# Patient Record
Sex: Female | Born: 1955
Health system: Southern US, Community
[De-identification: ages and names within clinical notes are randomized; demographics above are authoritative.]

## PROBLEM LIST (undated history)

## (undated) HISTORY — PX: TONSILECTOMY, ADENOIDECTOMY, BILATERAL MYRINGOTOMY AND TUBES: SHX2538

---

## 1997-06-10 ENCOUNTER — Ambulatory Visit (HOSPITAL_COMMUNITY): Admission: RE | Admit: 1997-06-10 | Discharge: 1997-06-10 | Payer: Self-pay | Admitting: Obstetrics and Gynecology

## 1997-06-15 ENCOUNTER — Ambulatory Visit (HOSPITAL_COMMUNITY): Admission: RE | Admit: 1997-06-15 | Discharge: 1997-06-15 | Payer: Self-pay | Admitting: Obstetrics and Gynecology

## 1998-01-10 ENCOUNTER — Ambulatory Visit (HOSPITAL_COMMUNITY): Admission: RE | Admit: 1998-01-10 | Discharge: 1998-01-10 | Payer: Self-pay | Admitting: Obstetrics and Gynecology

## 2003-06-14 ENCOUNTER — Ambulatory Visit (HOSPITAL_BASED_OUTPATIENT_CLINIC_OR_DEPARTMENT_OTHER): Admission: RE | Admit: 2003-06-14 | Discharge: 2003-06-14 | Payer: Self-pay | Admitting: Otolaryngology

## 2004-08-06 ENCOUNTER — Other Ambulatory Visit: Admission: RE | Admit: 2004-08-06 | Discharge: 2004-08-06 | Payer: Self-pay | Admitting: Family Medicine

## 2005-02-07 ENCOUNTER — Ambulatory Visit (HOSPITAL_COMMUNITY): Admission: RE | Admit: 2005-02-07 | Discharge: 2005-02-07 | Payer: Self-pay | Admitting: Family Medicine

## 2005-09-23 ENCOUNTER — Other Ambulatory Visit: Admission: RE | Admit: 2005-09-23 | Discharge: 2005-09-23 | Payer: Self-pay | Admitting: Family Medicine

## 2007-01-01 ENCOUNTER — Other Ambulatory Visit: Admission: RE | Admit: 2007-01-01 | Discharge: 2007-01-01 | Payer: Self-pay | Admitting: Family Medicine

## 2007-02-18 ENCOUNTER — Ambulatory Visit (HOSPITAL_COMMUNITY): Admission: RE | Admit: 2007-02-18 | Discharge: 2007-02-18 | Payer: Self-pay | Admitting: Family Medicine

## 2008-03-23 ENCOUNTER — Other Ambulatory Visit: Admission: RE | Admit: 2008-03-23 | Discharge: 2008-03-23 | Payer: Self-pay | Admitting: Family Medicine

## 2008-11-09 ENCOUNTER — Ambulatory Visit (HOSPITAL_COMMUNITY): Admission: RE | Admit: 2008-11-09 | Discharge: 2008-11-09 | Payer: Self-pay | Admitting: Family Medicine

## 2008-12-23 ENCOUNTER — Encounter: Admission: RE | Admit: 2008-12-23 | Discharge: 2008-12-23 | Payer: Self-pay | Admitting: Family Medicine

## 2009-07-07 ENCOUNTER — Other Ambulatory Visit: Admission: RE | Admit: 2009-07-07 | Discharge: 2009-07-07 | Payer: Self-pay | Admitting: Family Medicine

## 2009-12-13 IMAGING — US US BREAST L
1 series · 5 of 5 positions shown · non-contrast
Comparison: Prior mammograms dated 02/18/2007 and 11/09/2008

CLINICAL DATA: Palpable left breast mass

DIGITAL DIAGNOSTIC  LEFT  MAMMOGRAM  WITH CAD AND LEFT BREAST
ULTRASOUND:

[Series 1: us breast left · 5 of 5 slices shown]
[im 1/5]
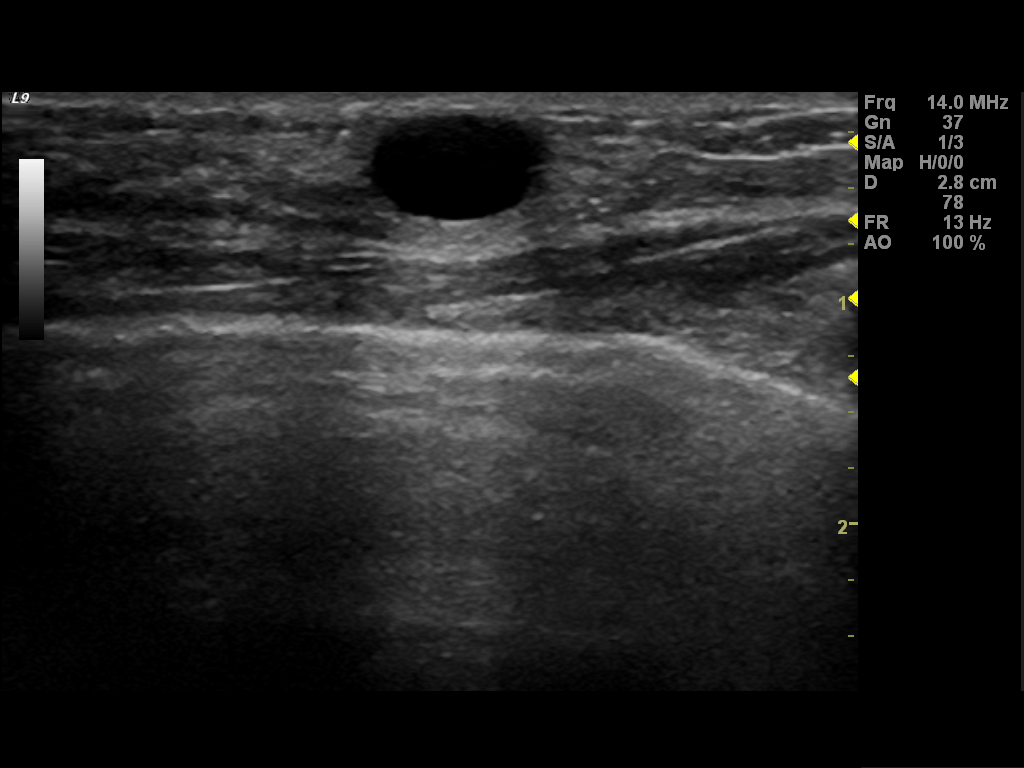
[im 2/5]
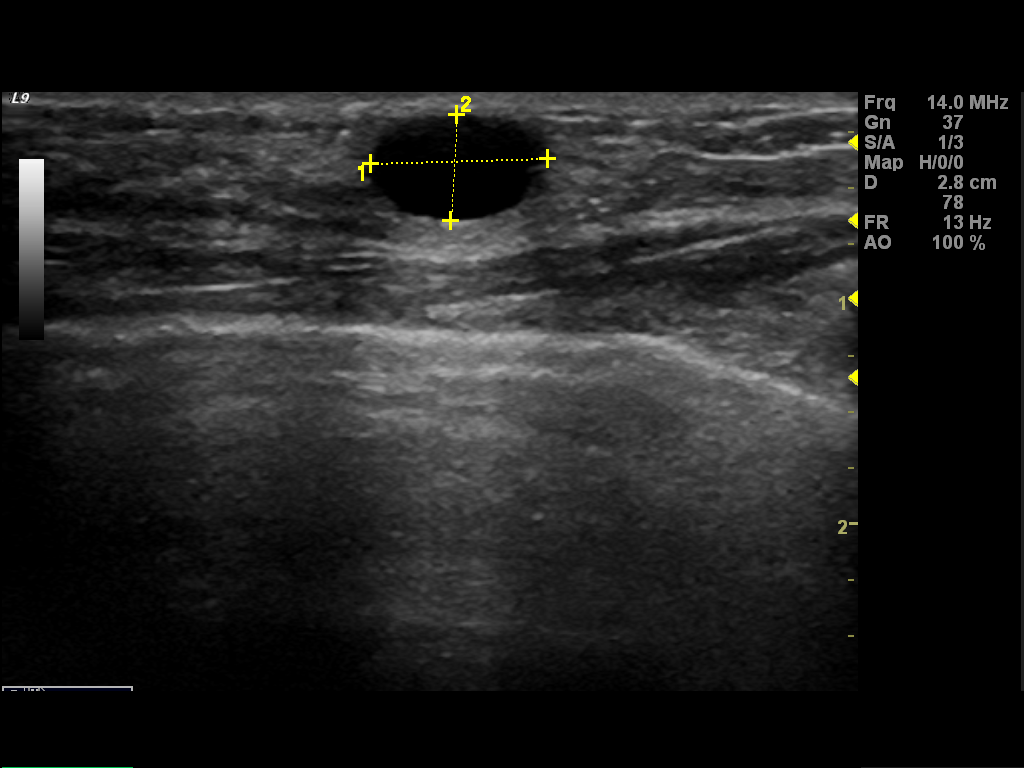
[im 3/5]
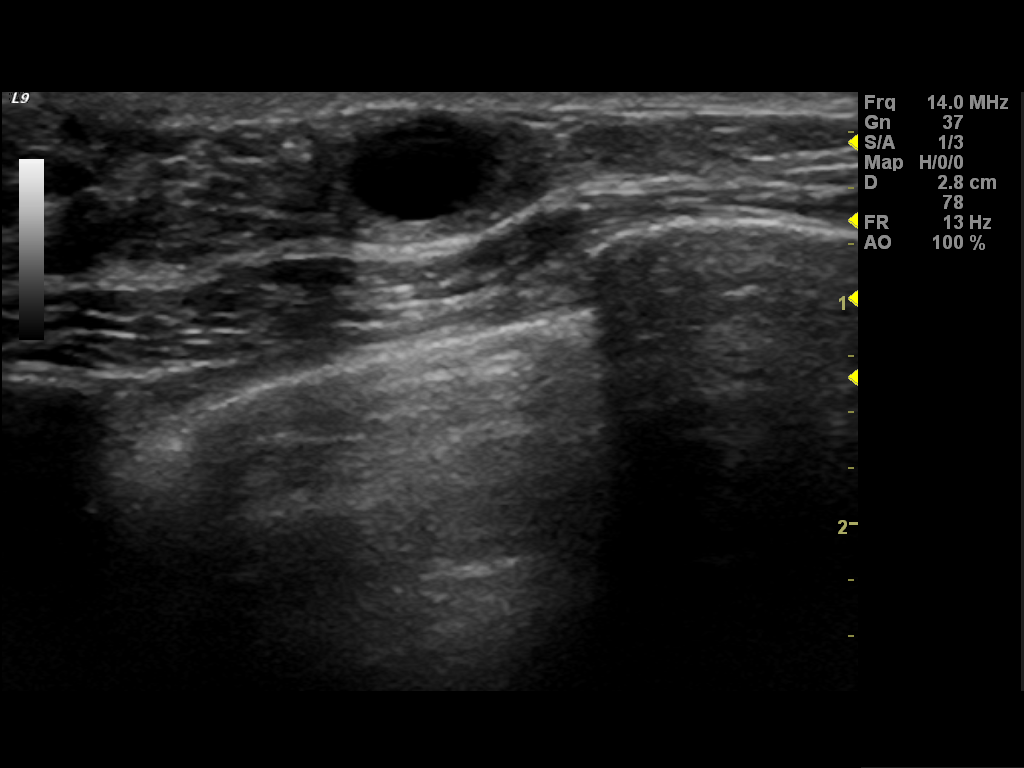
[im 4/5]
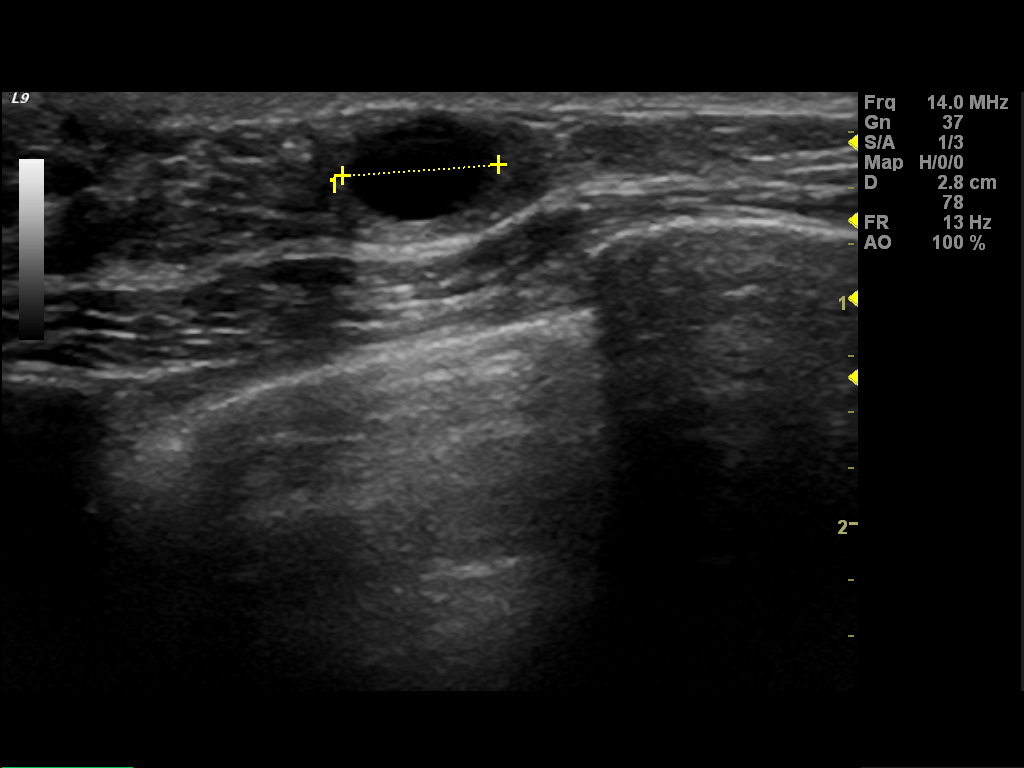
[im 5/5]
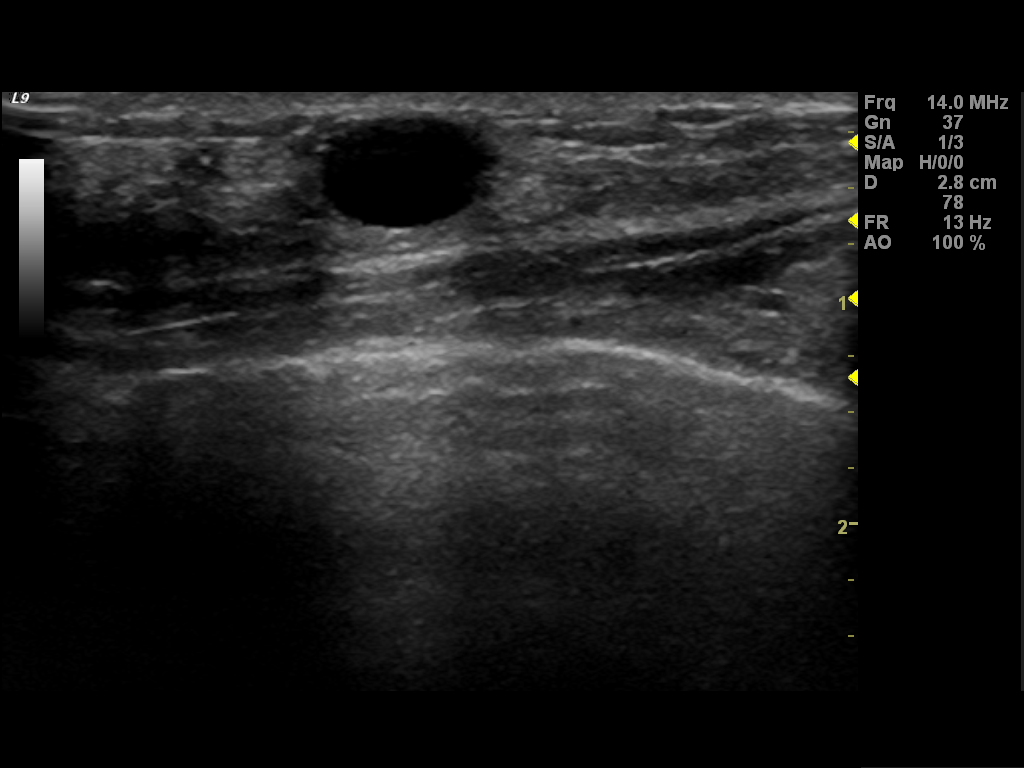

[5 of 5 positions shown; findings below may reference images not displayed]

FINDINGS: There is a dense fibroglandular pattern.  On the spot
tangential view an obscured mass is seen in the lower outer
quadrant of the breast.  There is no malignant-type
microcalcifications.
Mammographic images were processed with CAD.

On physical exam, , I palpate a discrete mass in the left breast at
5 o'clock 2 cm from the nipple.  It is freely mobile.

Ultrasound is performed, showing there is a simple cyst in the left
breast at 5 o'clock 2 cm from the nipple measuring 8 x 5 x 7 mm.
IMPRESSION: Left breast cysts.  No evidence of malignancy.  Bilateral screening
mammogram in November 2009 is recommended.

BI-RADS CATEGORY 2:  Benign finding(s).

## 2010-01-16 ENCOUNTER — Ambulatory Visit (HOSPITAL_COMMUNITY): Admission: RE | Admit: 2010-01-16 | Discharge: 2010-01-16 | Payer: Self-pay | Admitting: Family Medicine

## 2010-07-11 ENCOUNTER — Other Ambulatory Visit (HOSPITAL_COMMUNITY)
Admission: RE | Admit: 2010-07-11 | Discharge: 2010-07-11 | Disposition: A | Discharge status: Home / Self Care | Payer: 59 | Source: Ambulatory Visit | Attending: Family Medicine | Admitting: Family Medicine

## 2010-07-11 ENCOUNTER — Other Ambulatory Visit: Payer: Self-pay | Admitting: Family Medicine

## 2010-07-11 DIAGNOSIS — Z124 Encounter for screening for malignant neoplasm of cervix: Secondary | ICD-10-CM | POA: Insufficient documentation

## 2010-08-24 NOTE — Op Note (Signed)
NAME:  JHADE, BERKO                        ACCOUNT NO.:  [id number]   MEDICAL RECORD NO.:  [id number]                   PATIENT TYPE:  AMB   LOCATION:  [facility]                                  FACILITY:  [facility]   PHYSICIAN:  Christopher E. Ezzard Standing, M.D.         DATE OF BIRTH:  [birth year]   DATE OF PROCEDURE:  06/14/2003  DATE OF DISCHARGE:                                 OPERATIVE REPORT   PREOPERATIVE DIAGNOSIS:  Left tonsil cyst with inflammation.   POSTOPERATIVE DIAGNOSIS:  Left tonsil cyst with inflammation.   OPERATION:  Excision of left tonsil tissue.   SURGEON:  Kristine Garbe. Ezzard Standing, M.D.   ANESTHESIA:  Local 1% Xylocaine with 1:100,000 epinephrine.   COMPLICATIONS:  None.   BRIEF CLINICAL NOTE:  Annette Cannon is a 55 year old female who has had  previous tonsillectomy.  More recently, she has had problems with some  residual tonsillar tissue in the left tonsillar fossa.  She had recurrent  white debris collect within a tonsil cyst within the tonsil tissue.  The  residual area of tonsillar tissue measures approximately 1 by 2 cm size and  has a small cyst within the tonsillar tissue with white debris within the  tonsil cyst.  She is taken to the operating room at this time for excision  of residual tonsil tissue.   DESCRIPTION OF PROCEDURE:  The left tonsil area was injected with 2 mL of  Xylocaine with epinephrine for local anesthesia.  The residual tonsillar  tissue was then excised with a 15 blade scalpel and curved scissors.  After  excising the tonsillar tissue, the area was cauterized with silver nitrate.  The patient did not have any substantial bleeding.  She tolerated this well.   DISPOSITION:  Zaria is discharged home later this morning on Amoxicillin  500 mg t.i.d. for five days, Tylenol and Vicodin p.r.n. pain.  She is to  notify the office if she has any problems.                                               Kristine Garbe. Ezzard Standing, M.D.    CEN/MEDQ   D:  06/14/2003  T:  06/14/2003  Job:  [id number]

## 2011-01-29 ENCOUNTER — Other Ambulatory Visit (HOSPITAL_COMMUNITY): Payer: Self-pay | Admitting: Family Medicine

## 2011-01-29 DIAGNOSIS — Z1231 Encounter for screening mammogram for malignant neoplasm of breast: Secondary | ICD-10-CM

## 2011-02-18 ENCOUNTER — Ambulatory Visit (HOSPITAL_COMMUNITY)
Admission: RE | Admit: 2011-02-18 | Discharge: 2011-02-18 | Disposition: A | Discharge status: Home / Self Care | Payer: 59 | Source: Ambulatory Visit | Attending: Family Medicine | Admitting: Family Medicine

## 2011-02-18 DIAGNOSIS — Z1231 Encounter for screening mammogram for malignant neoplasm of breast: Secondary | ICD-10-CM

## 2012-09-23 ENCOUNTER — Other Ambulatory Visit (HOSPITAL_COMMUNITY): Payer: Self-pay | Admitting: Family Medicine

## 2012-09-23 DIAGNOSIS — Z1231 Encounter for screening mammogram for malignant neoplasm of breast: Secondary | ICD-10-CM

## 2012-10-13 ENCOUNTER — Ambulatory Visit (HOSPITAL_COMMUNITY)
Admission: RE | Admit: 2012-10-13 | Discharge: 2012-10-13 | Disposition: A | Discharge status: Home / Self Care | Payer: 59 | Source: Ambulatory Visit | Attending: Family Medicine | Admitting: Family Medicine

## 2012-10-13 DIAGNOSIS — Z1231 Encounter for screening mammogram for malignant neoplasm of breast: Secondary | ICD-10-CM

## 2012-11-26 ENCOUNTER — Other Ambulatory Visit: Payer: Self-pay | Admitting: Obstetrics and Gynecology

## 2013-07-22 ENCOUNTER — Other Ambulatory Visit (HOSPITAL_COMMUNITY)
Admission: RE | Admit: 2013-07-22 | Discharge: 2013-07-22 | Disposition: A | Discharge status: Home / Self Care | Payer: 59 | Source: Ambulatory Visit | Attending: Family Medicine | Admitting: Family Medicine

## 2013-07-22 ENCOUNTER — Other Ambulatory Visit: Payer: Self-pay | Admitting: Family Medicine

## 2013-07-22 DIAGNOSIS — Z124 Encounter for screening for malignant neoplasm of cervix: Secondary | ICD-10-CM | POA: Insufficient documentation

## 2016-04-26 DIAGNOSIS — S99929A Unspecified injury of unspecified foot, initial encounter: Secondary | ICD-10-CM | POA: Diagnosis not present

## 2016-06-18 DIAGNOSIS — N63 Unspecified lump in unspecified breast: Secondary | ICD-10-CM | POA: Diagnosis not present

## 2016-06-21 ENCOUNTER — Other Ambulatory Visit: Payer: Self-pay | Admitting: Gynecology

## 2016-06-21 DIAGNOSIS — N63 Unspecified lump in unspecified breast: Secondary | ICD-10-CM

## 2016-07-02 ENCOUNTER — Ambulatory Visit
Admission: RE | Admit: 2016-07-02 | Discharge: 2016-07-02 | Disposition: A | Discharge status: Home / Self Care | Payer: 59 | Source: Ambulatory Visit | Attending: Gynecology | Admitting: Gynecology

## 2016-07-02 DIAGNOSIS — N6321 Unspecified lump in the left breast, upper outer quadrant: Secondary | ICD-10-CM | POA: Diagnosis not present

## 2016-07-02 DIAGNOSIS — N63 Unspecified lump in unspecified breast: Secondary | ICD-10-CM

## 2016-07-02 DIAGNOSIS — N6012 Diffuse cystic mastopathy of left breast: Secondary | ICD-10-CM | POA: Diagnosis not present

## 2016-07-02 DIAGNOSIS — N6313 Unspecified lump in the right breast, lower outer quadrant: Secondary | ICD-10-CM | POA: Diagnosis not present

## 2016-08-06 DIAGNOSIS — Z01419 Encounter for gynecological examination (general) (routine) without abnormal findings: Secondary | ICD-10-CM | POA: Diagnosis not present

## 2016-08-06 DIAGNOSIS — Z780 Asymptomatic menopausal state: Secondary | ICD-10-CM | POA: Diagnosis not present

## 2016-08-08 ENCOUNTER — Other Ambulatory Visit: Payer: Self-pay | Admitting: Family Medicine

## 2016-08-08 ENCOUNTER — Other Ambulatory Visit (HOSPITAL_COMMUNITY)
Admission: RE | Admit: 2016-08-08 | Discharge: 2016-08-08 | Disposition: A | Discharge status: Home / Self Care | Payer: 59 | Source: Ambulatory Visit | Attending: Family Medicine | Admitting: Family Medicine

## 2016-08-08 DIAGNOSIS — Z124 Encounter for screening for malignant neoplasm of cervix: Secondary | ICD-10-CM | POA: Diagnosis not present

## 2016-08-08 DIAGNOSIS — N951 Menopausal and female climacteric states: Secondary | ICD-10-CM | POA: Diagnosis not present

## 2016-08-08 DIAGNOSIS — Z0000 Encounter for general adult medical examination without abnormal findings: Secondary | ICD-10-CM | POA: Diagnosis not present

## 2016-08-08 DIAGNOSIS — M859 Disorder of bone density and structure, unspecified: Secondary | ICD-10-CM | POA: Diagnosis not present

## 2016-08-12 LAB — CYTOLOGY - PAP: Diagnosis: NEGATIVE

## 2016-10-04 DIAGNOSIS — L578 Other skin changes due to chronic exposure to nonionizing radiation: Secondary | ICD-10-CM | POA: Diagnosis not present

## 2016-10-04 DIAGNOSIS — D225 Melanocytic nevi of trunk: Secondary | ICD-10-CM | POA: Diagnosis not present

## 2016-10-04 DIAGNOSIS — L237 Allergic contact dermatitis due to plants, except food: Secondary | ICD-10-CM | POA: Diagnosis not present

## 2016-10-16 DIAGNOSIS — M25511 Pain in right shoulder: Secondary | ICD-10-CM | POA: Diagnosis not present

## 2016-10-16 DIAGNOSIS — M19011 Primary osteoarthritis, right shoulder: Secondary | ICD-10-CM | POA: Diagnosis not present

## 2016-11-05 DIAGNOSIS — M25511 Pain in right shoulder: Secondary | ICD-10-CM | POA: Diagnosis not present

## 2016-11-13 DIAGNOSIS — M19011 Primary osteoarthritis, right shoulder: Secondary | ICD-10-CM | POA: Diagnosis not present

## 2017-01-17 ENCOUNTER — Other Ambulatory Visit: Payer: Self-pay | Admitting: Gynecology

## 2017-01-17 DIAGNOSIS — N6001 Solitary cyst of right breast: Secondary | ICD-10-CM

## 2017-02-18 ENCOUNTER — Ambulatory Visit
Admission: RE | Admit: 2017-02-18 | Discharge: 2017-02-18 | Disposition: A | Discharge status: Home / Self Care | Payer: 59 | Source: Ambulatory Visit | Attending: Gynecology | Admitting: Gynecology

## 2017-02-18 ENCOUNTER — Other Ambulatory Visit: Payer: Self-pay | Admitting: Gynecology

## 2017-02-18 DIAGNOSIS — N6313 Unspecified lump in the right breast, lower outer quadrant: Secondary | ICD-10-CM | POA: Diagnosis not present

## 2017-02-18 DIAGNOSIS — N6001 Solitary cyst of right breast: Secondary | ICD-10-CM

## 2017-02-18 DIAGNOSIS — N6311 Unspecified lump in the right breast, upper outer quadrant: Secondary | ICD-10-CM | POA: Diagnosis not present

## 2017-03-10 DIAGNOSIS — Z23 Encounter for immunization: Secondary | ICD-10-CM | POA: Diagnosis not present

## 2017-06-19 ENCOUNTER — Other Ambulatory Visit: Payer: 59

## 2017-06-23 ENCOUNTER — Encounter: Payer: Self-pay | Admitting: Podiatry

## 2017-06-23 ENCOUNTER — Other Ambulatory Visit: Payer: Self-pay

## 2017-06-23 ENCOUNTER — Telehealth: Payer: Self-pay | Admitting: Podiatry

## 2017-06-23 ENCOUNTER — Ambulatory Visit: Payer: 59 | Admitting: Podiatry

## 2017-06-23 DIAGNOSIS — Z780 Asymptomatic menopausal state: Secondary | ICD-10-CM | POA: Insufficient documentation

## 2017-06-23 DIAGNOSIS — L03031 Cellulitis of right toe: Secondary | ICD-10-CM | POA: Diagnosis not present

## 2017-06-23 NOTE — Telephone Encounter (Signed)
I just got home from having a procedure done by Dr. Paulla Dolly and I just have a few questions. My long term care instructions show that you have had your ingrown toenail and root treated with a chemical. The doctor said that he did not do the treatment with a chemical so I would like that removed from my chart as I understand that was not done, he didn't see the need to do that. Then my other question is it okay to take Aleve for pain or should it be another product? Is there a reason you would prefer not to have Aleve. I would appreciate a call back so I can take some pain medication before too much longer and take the right thing. You can reach me at [phone]. Thank you so much.

## 2017-06-23 NOTE — Telephone Encounter (Signed)
I informed pt that in her clinicals her diagnosis was paronychia, which was a removal without the application of the chemical, pt states she was concerned that the statement "you have had a chemical applied" would cause a problem with insurance coverage. I told pt I would see if that could be removed from her chart, and I would make a note in the records of this call. Pt asked if it was okay to take Aleve and I told her if it would help with her discomfort that would be fine.

## 2017-06-23 NOTE — Progress Notes (Signed)
Subjective:   Patient ID: Annette Cannon, female   DOB: 62 y.o.   MRN: [id number]   HPI Patient presents with an incurvated ingrown toenail right hallux medial border mostly in the proximal portion of the nail bed with redness and discomfort for about 5 weeks.  Patient does not smoke and likes to be active   Review of Systems  All other systems reviewed and are negative.       Objective:  Physical Exam  Constitutional: She appears well-developed and well-nourished.  Cardiovascular: Intact distal pulses.  Pulmonary/Chest: Effort normal.  Musculoskeletal: Normal range of motion.  Neurological: She is alert.  Skin: Skin is warm.  Nursing note and vitals reviewed.   Neurovascular status found to be intact muscle strength was adequate range of motion within normal limits with patient found to have slight redness on the proximal portion of the right hallux nail medial side localized in nature with no distal drainage noted.  Patient has good digital perfusion well oriented x3    Assessment:  Localized paronychia infection right hallux medial border with probable ingrown toenail component     Plan:  H&P conditions reviewed and I recommended removal of the nail border allowing drainage to occur and possible permanent procedure at one point future.  Today I infiltrated the right hallux 60 mg like Marcaine mixture and I removed the medial border under sterile technique after sterile prep of the toe flush the area applied sterile dressing instructed on soaks and bandage therapy and reappoint if symptoms were to persist

## 2017-07-03 ENCOUNTER — Ambulatory Visit
Admission: RE | Admit: 2017-07-03 | Discharge: 2017-07-03 | Disposition: A | Discharge status: Home / Self Care | Payer: 59 | Source: Ambulatory Visit | Attending: Gynecology | Admitting: Gynecology

## 2017-07-03 DIAGNOSIS — N6310 Unspecified lump in the right breast, unspecified quadrant: Secondary | ICD-10-CM | POA: Diagnosis not present

## 2017-07-03 DIAGNOSIS — N6001 Solitary cyst of right breast: Secondary | ICD-10-CM

## 2017-07-03 DIAGNOSIS — R922 Inconclusive mammogram: Secondary | ICD-10-CM | POA: Diagnosis not present

## 2017-07-08 ENCOUNTER — Telehealth: Payer: Self-pay | Admitting: Podiatry

## 2017-07-08 NOTE — Telephone Encounter (Signed)
Pt states the area seems to be healing well, but she would like to know if what she experienced at the time of the appt. Pt states she was not fully prepared for the procedure, in that she had other plans and did not know it would cause sucha disruption in her activities due to pain, and shoe wear. Pt states she was not numb for the procedure and when Dr. Paulla Dolly was informed he said well let's see and performed the procedure, although it was excruciating, then once it was complete said I am sorry and I will put a little more medicine in now, but if I had done it before it would have taken longer. Pt states she did not get our after visit instructions and was in exquisite pain all night and only got relief with the foot down. I apologized to the pt and said it was not the normal operation procedure and I would inform the office manager and we would use it as a learning experience for [place].

## 2017-07-08 NOTE — Telephone Encounter (Signed)
I had a paronychia procedure done two weeks ago yesterday I think. I have some questions and I wanted to check in with the nurse. I would appreciate a call back at [phone]. Thank you.

## 2017-08-28 DIAGNOSIS — Z0000 Encounter for general adult medical examination without abnormal findings: Secondary | ICD-10-CM | POA: Diagnosis not present

## 2017-09-30 DIAGNOSIS — H1013 Acute atopic conjunctivitis, bilateral: Secondary | ICD-10-CM | POA: Diagnosis not present

## 2017-10-02 DIAGNOSIS — Z780 Asymptomatic menopausal state: Secondary | ICD-10-CM | POA: Diagnosis not present

## 2017-10-02 DIAGNOSIS — Z01419 Encounter for gynecological examination (general) (routine) without abnormal findings: Secondary | ICD-10-CM | POA: Diagnosis not present

## 2017-10-03 DIAGNOSIS — D225 Melanocytic nevi of trunk: Secondary | ICD-10-CM | POA: Diagnosis not present

## 2017-10-03 DIAGNOSIS — L578 Other skin changes due to chronic exposure to nonionizing radiation: Secondary | ICD-10-CM | POA: Diagnosis not present

## 2019-01-20 ENCOUNTER — Other Ambulatory Visit: Payer: Self-pay | Admitting: Gynecology

## 2019-01-20 DIAGNOSIS — N6310 Unspecified lump in the right breast, unspecified quadrant: Secondary | ICD-10-CM

## 2019-02-26 ENCOUNTER — Ambulatory Visit
Admission: RE | Admit: 2019-02-26 | Discharge: 2019-02-26 | Disposition: A | Discharge status: Home / Self Care | Payer: Medicare Other | Source: Ambulatory Visit | Attending: Gynecology | Admitting: Gynecology

## 2019-02-26 ENCOUNTER — Other Ambulatory Visit: Payer: Self-pay

## 2019-02-26 ENCOUNTER — Other Ambulatory Visit: Payer: Self-pay | Admitting: Gynecology

## 2019-02-26 DIAGNOSIS — N6310 Unspecified lump in the right breast, unspecified quadrant: Secondary | ICD-10-CM

## 2019-05-04 ENCOUNTER — Ambulatory Visit: Payer: Medicare Other

## 2019-05-13 ENCOUNTER — Ambulatory Visit: Payer: Medicare Other | Attending: Internal Medicine

## 2019-05-13 DIAGNOSIS — Z23 Encounter for immunization: Secondary | ICD-10-CM | POA: Insufficient documentation

## 2019-05-13 NOTE — Progress Notes (Signed)
   Covid-19 Vaccination Clinic  Name:  Annette Cannon    MRN: [id number] DOB: 07-30-1955  05/13/2019  [name] was observed post Covid-19 immunization for 15 minutes without incidence. She was provided with Vaccine Information Sheet and instruction to access the V-Safe system.   [name] was instructed to call 911 with any severe reactions post vaccine: Marland Kitchen Difficulty breathing  . Swelling of your face and throat  . A fast heartbeat  . A bad rash all over your body  . Dizziness and weakness    Immunizations Administered    Name Date Dose VIS Date Route   Pfizer COVID-19 Vaccine 05/13/2019 10:02 AM 0.3 mL 03/19/2019 Intramuscular   Manufacturer: [place]   Lot: [id number]   [place]: [id number]

## 2019-05-25 ENCOUNTER — Ambulatory Visit: Payer: Medicare Other

## 2019-06-07 ENCOUNTER — Ambulatory Visit: Payer: Medicare Other | Attending: Internal Medicine

## 2019-06-07 DIAGNOSIS — Z23 Encounter for immunization: Secondary | ICD-10-CM | POA: Insufficient documentation

## 2019-06-07 NOTE — Progress Notes (Signed)
   Covid-19 Vaccination Clinic  Name:  AYVIANA CANTO    MRN: [id number] DOB: [birth year]  06/07/2019  Ms. Butler was observed post Covid-19 immunization for 15 minutes without incidence. She was provided with Vaccine Information Sheet and instruction to access the V-Safe system.   Ms. Kibler was instructed to call 911 with any severe reactions post vaccine: Marland Kitchen Difficulty breathing  . Swelling of your face and throat  . A fast heartbeat  . A bad rash all over your body  . Dizziness and weakness    Immunizations Administered    Name Date Dose VIS Date Route   Pfizer COVID-19 Vaccine 06/07/2019  2:10 PM 0.3 mL 03/19/2019 Intramuscular   Manufacturer: [place]   Lot: [id number]   [place]: [id number]

## 2019-08-27 ENCOUNTER — Ambulatory Visit
Admission: RE | Admit: 2019-08-27 | Discharge: 2019-08-27 | Disposition: A | Discharge status: Home / Self Care | Payer: Medicare Other | Source: Ambulatory Visit | Attending: Gynecology | Admitting: Gynecology

## 2019-08-27 ENCOUNTER — Other Ambulatory Visit: Payer: Self-pay

## 2019-08-27 DIAGNOSIS — N6310 Unspecified lump in the right breast, unspecified quadrant: Secondary | ICD-10-CM

## 2019-09-02 ENCOUNTER — Other Ambulatory Visit: Payer: Self-pay | Admitting: Family Medicine

## 2019-09-02 DIAGNOSIS — M859 Disorder of bone density and structure, unspecified: Secondary | ICD-10-CM

## 2019-12-16 ENCOUNTER — Other Ambulatory Visit: Payer: Self-pay | Admitting: Family Medicine

## 2019-12-16 DIAGNOSIS — M859 Disorder of bone density and structure, unspecified: Secondary | ICD-10-CM

## 2019-12-16 DIAGNOSIS — M8580 Other specified disorders of bone density and structure, unspecified site: Secondary | ICD-10-CM

## 2019-12-20 ENCOUNTER — Ambulatory Visit
Admission: RE | Admit: 2019-12-20 | Discharge: 2019-12-20 | Disposition: A | Discharge status: Home / Self Care | Payer: Medicare Other | Source: Ambulatory Visit | Attending: Family Medicine | Admitting: Family Medicine

## 2019-12-20 ENCOUNTER — Other Ambulatory Visit: Payer: Self-pay

## 2019-12-20 DIAGNOSIS — M8580 Other specified disorders of bone density and structure, unspecified site: Secondary | ICD-10-CM

## 2019-12-20 DIAGNOSIS — M859 Disorder of bone density and structure, unspecified: Secondary | ICD-10-CM

## 2019-12-31 ENCOUNTER — Other Ambulatory Visit: Payer: Self-pay | Admitting: Gynecology

## 2019-12-31 DIAGNOSIS — Z1231 Encounter for screening mammogram for malignant neoplasm of breast: Secondary | ICD-10-CM

## 2020-02-28 ENCOUNTER — Other Ambulatory Visit: Payer: Self-pay

## 2020-02-28 ENCOUNTER — Ambulatory Visit
Admission: RE | Admit: 2020-02-28 | Discharge: 2020-02-28 | Disposition: A | Discharge status: Home / Self Care | Payer: Medicare Other | Source: Ambulatory Visit | Attending: Gynecology | Admitting: Gynecology

## 2020-02-28 DIAGNOSIS — Z1231 Encounter for screening mammogram for malignant neoplasm of breast: Secondary | ICD-10-CM

## 2021-01-15 ENCOUNTER — Other Ambulatory Visit: Payer: Self-pay | Admitting: Gynecology

## 2021-01-15 DIAGNOSIS — Z1231 Encounter for screening mammogram for malignant neoplasm of breast: Secondary | ICD-10-CM

## 2021-02-17 IMAGING — MG DIGITAL SCREENING BILAT W/ TOMO W/ CAD
8 series · 9 of 24 positions shown · non-contrast
Comparison: Previous exam(s).

CLINICAL DATA: Screening.

EXAM:
DIGITAL SCREENING BILATERAL MAMMOGRAM WITH TOMO AND CAD

[L MLO synth-2D]
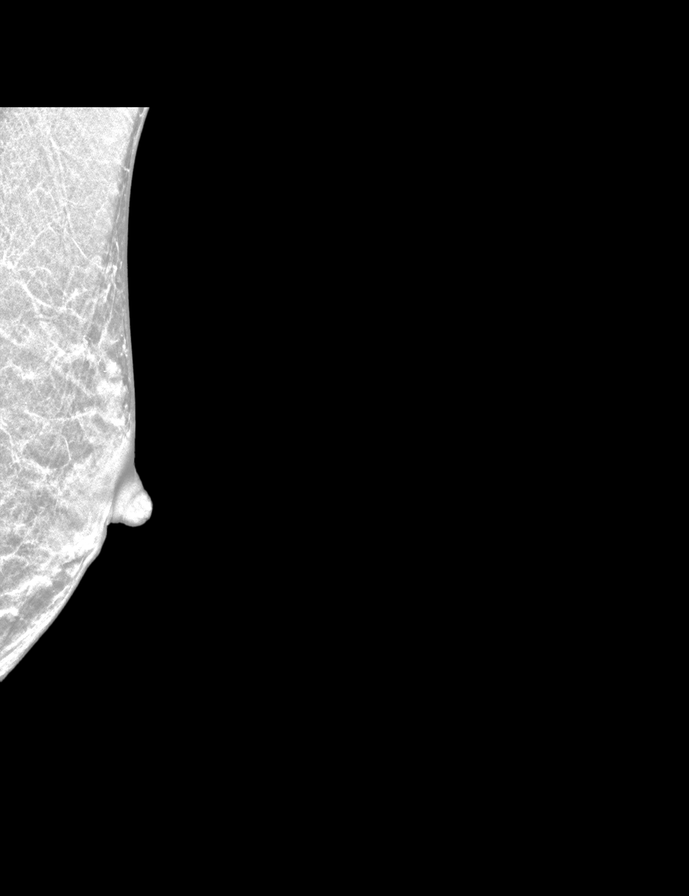

[R CC synth-2D]
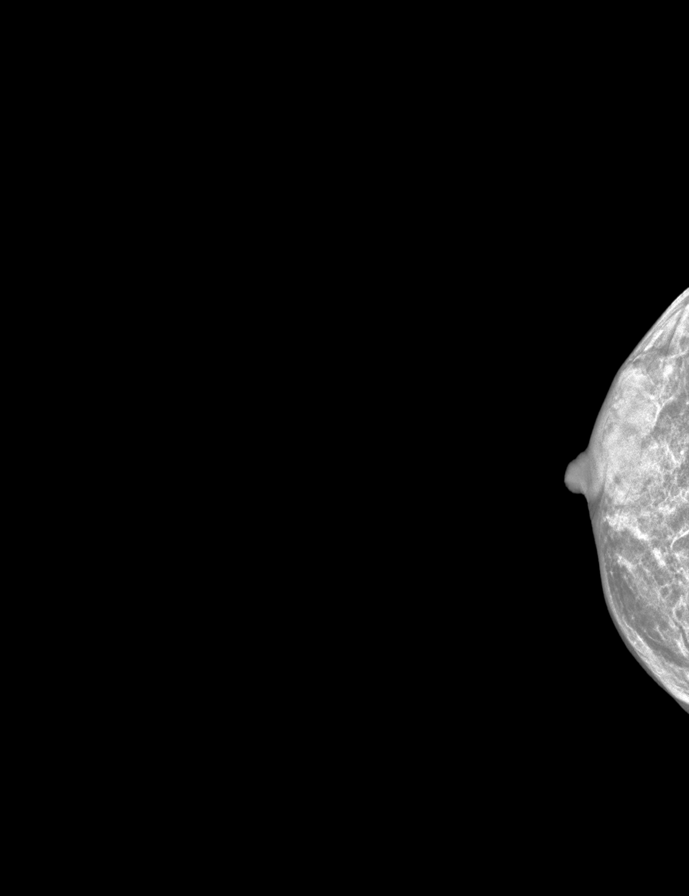

[L CC synth-2D]
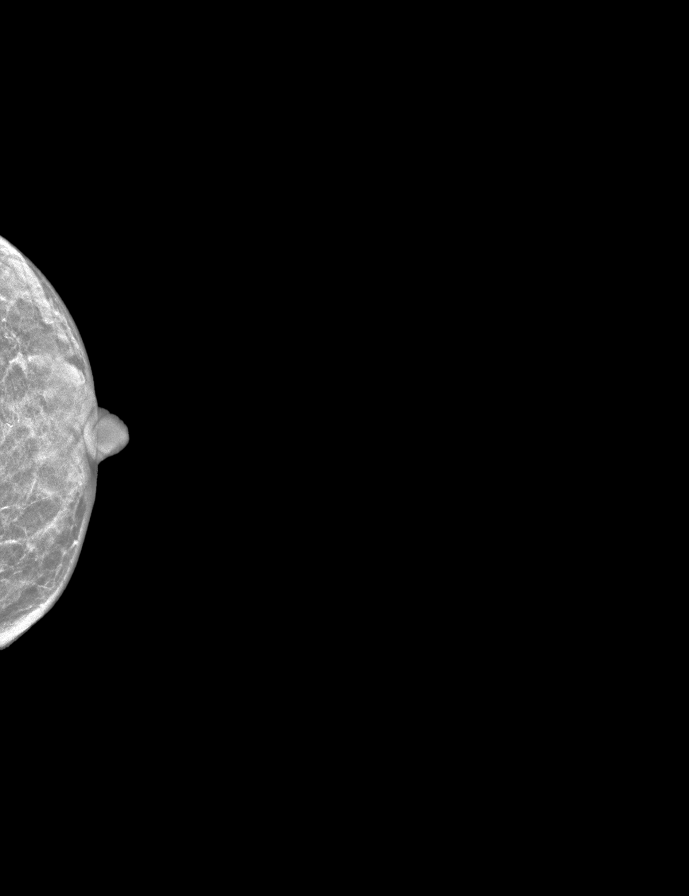

[R MLO synth-2D]
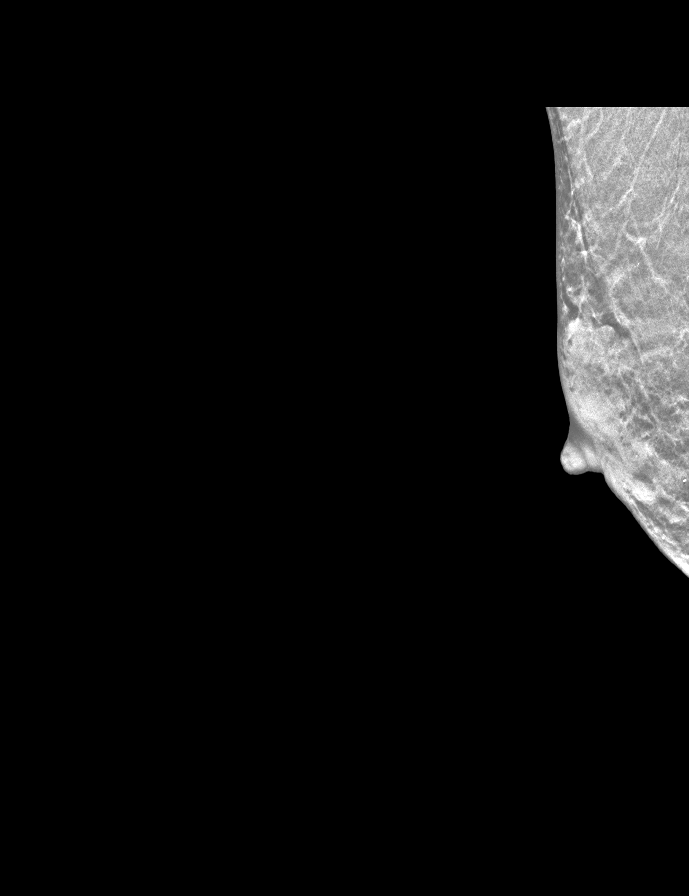

[L MLO tomo · 2 of 23 frames shown]
[frame 8/23]
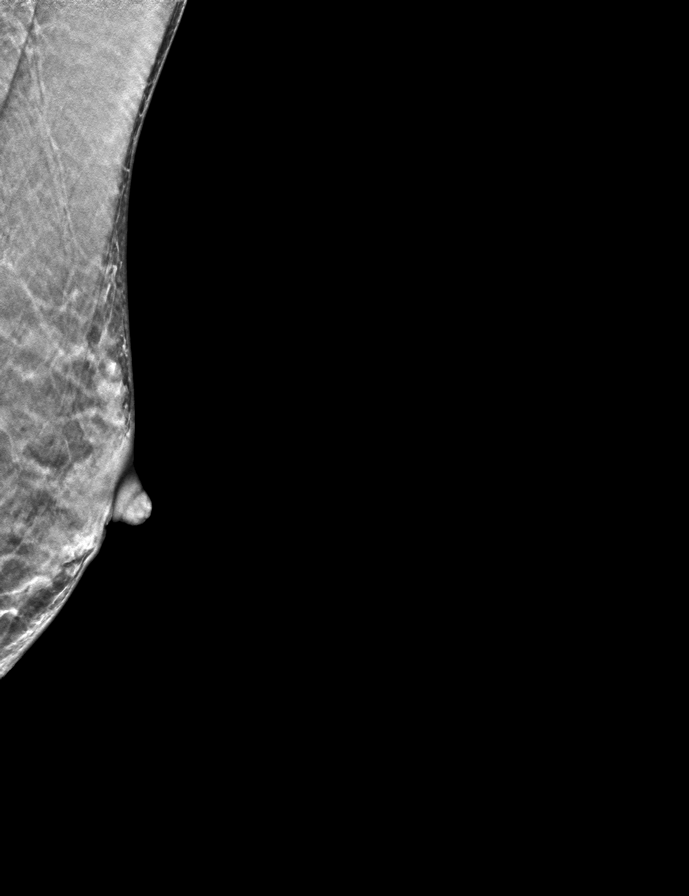
[frame 12/23]
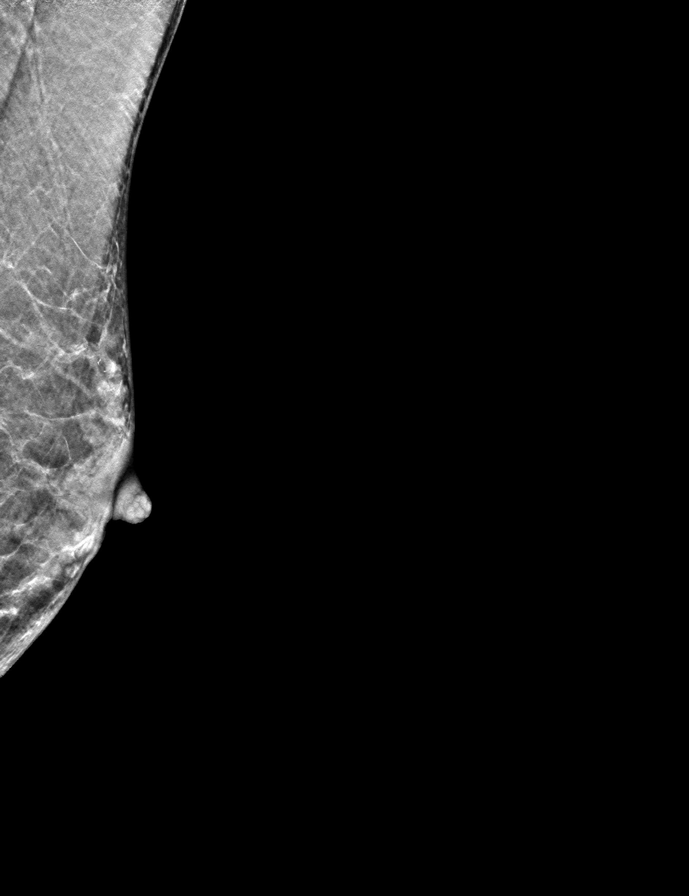

[L CC tomo · tomo slice 13/24.0]
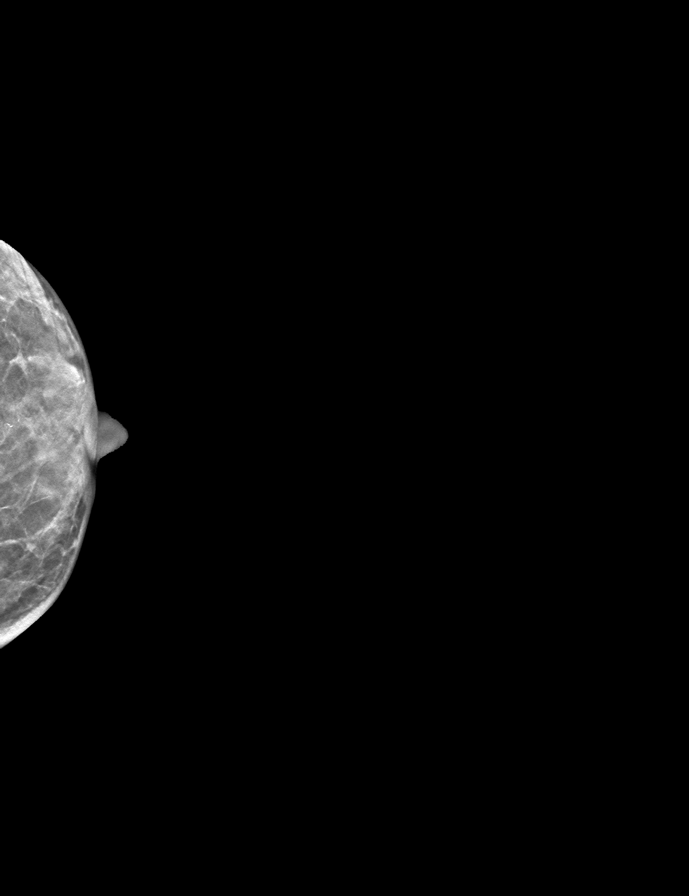

[R CC tomo · tomo slice 14/27.0]
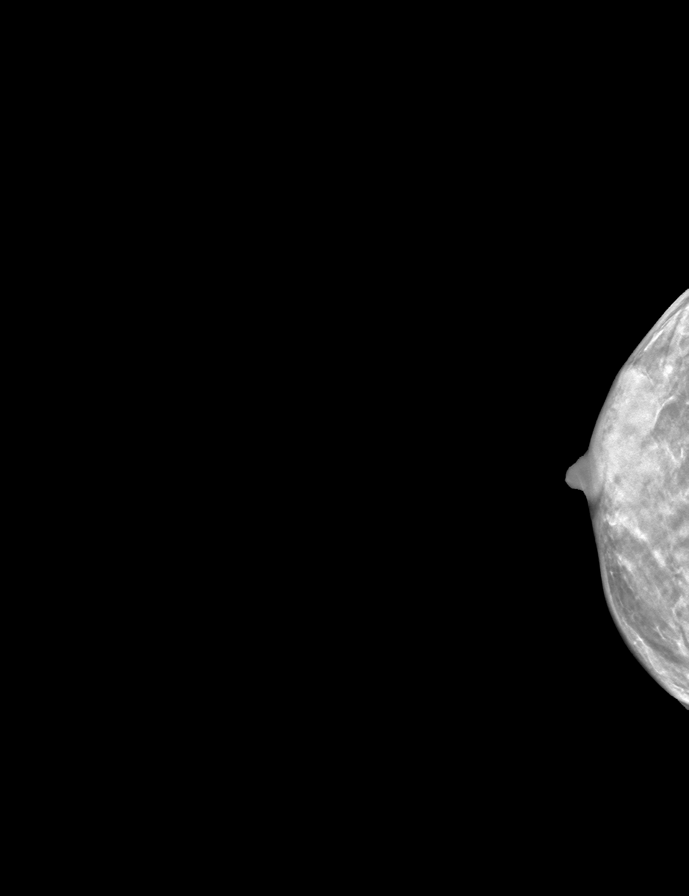

[R MLO tomo · tomo slice 13/24.0]
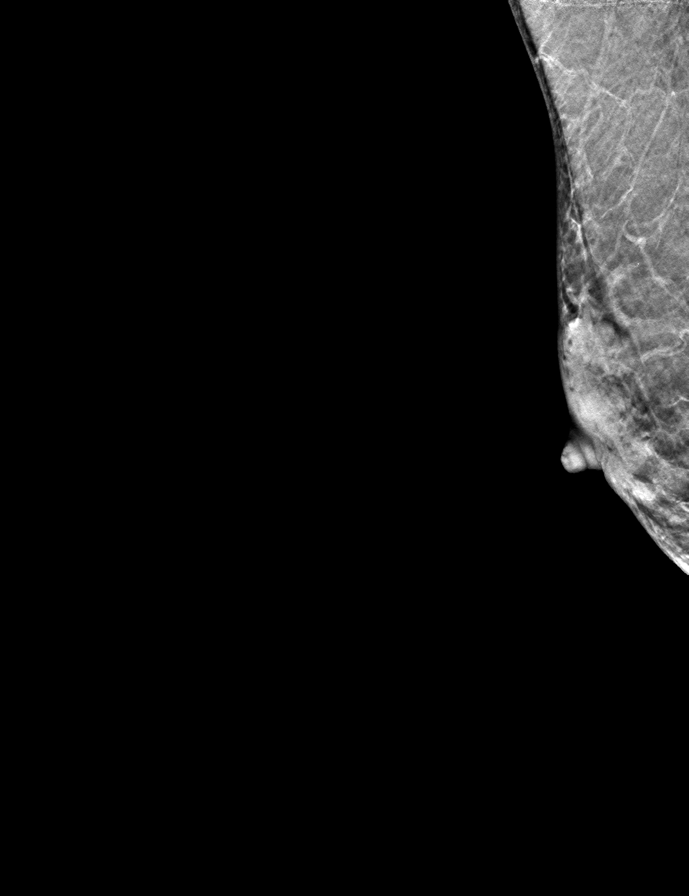

[9 of 24 positions shown; findings below may reference images not displayed]

ACR Breast Density Category c: The breast tissue is heterogeneously
dense, which may obscure small masses.
FINDINGS: There are no findings suspicious for malignancy. Images were
processed with CAD.
IMPRESSION: No mammographic evidence of malignancy. A result letter of this
screening mammogram will be mailed directly to the patient.

RECOMMENDATION:
Screening mammogram in one year. (Code:[id number])

BI-RADS CATEGORY  1: Negative.

## 2021-03-08 ENCOUNTER — Other Ambulatory Visit: Payer: Self-pay

## 2021-03-08 ENCOUNTER — Ambulatory Visit
Admission: RE | Admit: 2021-03-08 | Discharge: 2021-03-08 | Disposition: A | Discharge status: Home / Self Care | Payer: Medicare Other | Source: Ambulatory Visit | Attending: Gynecology | Admitting: Gynecology

## 2021-03-08 DIAGNOSIS — Z1231 Encounter for screening mammogram for malignant neoplasm of breast: Secondary | ICD-10-CM

## 2022-02-06 ENCOUNTER — Other Ambulatory Visit: Payer: Self-pay | Admitting: Gynecology

## 2022-02-06 DIAGNOSIS — Z1231 Encounter for screening mammogram for malignant neoplasm of breast: Secondary | ICD-10-CM

## 2022-04-04 ENCOUNTER — Ambulatory Visit
Admission: RE | Admit: 2022-04-04 | Discharge: 2022-04-04 | Disposition: A | Discharge status: Home / Self Care | Payer: Medicare Other | Source: Ambulatory Visit | Attending: Gynecology | Admitting: Gynecology

## 2022-04-04 DIAGNOSIS — Z1231 Encounter for screening mammogram for malignant neoplasm of breast: Secondary | ICD-10-CM

## 2022-04-09 ENCOUNTER — Other Ambulatory Visit: Payer: Self-pay | Admitting: Gynecology

## 2022-04-09 DIAGNOSIS — R928 Other abnormal and inconclusive findings on diagnostic imaging of breast: Secondary | ICD-10-CM

## 2022-04-15 ENCOUNTER — Ambulatory Visit
Admission: RE | Admit: 2022-04-15 | Discharge: 2022-04-15 | Disposition: A | Discharge status: Home / Self Care | Payer: Medicare Other | Source: Ambulatory Visit | Attending: Gynecology | Admitting: Gynecology

## 2022-04-15 ENCOUNTER — Other Ambulatory Visit: Payer: Self-pay | Admitting: Gynecology

## 2022-04-15 DIAGNOSIS — N6310 Unspecified lump in the right breast, unspecified quadrant: Secondary | ICD-10-CM

## 2022-04-15 DIAGNOSIS — R928 Other abnormal and inconclusive findings on diagnostic imaging of breast: Secondary | ICD-10-CM

## 2022-04-18 ENCOUNTER — Ambulatory Visit
Admission: RE | Admit: 2022-04-18 | Discharge: 2022-04-18 | Disposition: A | Discharge status: Home / Self Care | Payer: Medicare Other | Source: Ambulatory Visit | Attending: Gynecology | Admitting: Gynecology

## 2022-04-18 DIAGNOSIS — N6310 Unspecified lump in the right breast, unspecified quadrant: Secondary | ICD-10-CM

## 2022-04-18 HISTORY — PX: BREAST BIOPSY: SHX20

## 2022-04-25 ENCOUNTER — Telehealth: Payer: Self-pay | Admitting: Hematology and Oncology

## 2022-04-25 NOTE — Telephone Encounter (Signed)
Spoke to patient to confirm upcoming morning [facility] clinic appointment on 1/31 patient will pick up paperwork.   Gave location and time, also informed patient that the surgeon's office would be calling as well to get information from them similar to the packet that they will be receiving so make sure to do both.  Reminded patient that all providers will be coming to the clinic to see them HERE and if they had any questions to not hesitate to reach back out to myself or their navigators.

## 2022-05-06 ENCOUNTER — Encounter: Payer: Self-pay | Admitting: *Deleted

## 2022-05-06 DIAGNOSIS — D0511 Intraductal carcinoma in situ of right breast: Secondary | ICD-10-CM | POA: Insufficient documentation

## 2022-05-07 NOTE — Progress Notes (Signed)
Radiation Oncology         [phone] ________________________________  Initial Outpatient Consultation  Name: Annette Cannon MRN: [id number]  Date: 05/08/2022  DOB: 1955/09/10  IR:CVELFYB, Michell Heinrich, MD  Erroll Luna, MD   REFERRING PHYSICIAN: Erroll Luna, MD  DIAGNOSIS: No diagnosis found.   Cancer Staging  No matching staging information was found for the patient.  Stage 0 (cTis (DCIS), cN0, cM0) Right Breast, Low-grade DCIS ER+ / PR+ / Her2 not assessed  CHIEF COMPLAINT: Here to discuss management of right breast DCIS  HISTORY OF PRESENT ILLNESS::Annette Cannon is a 67 y.o. female who presented with a right breast abnormality on the following imaging: bilateral screening mammogram on the date of 04/04/22. No symptoms, if any, were reported at that time. Diagnostic right breast mammogram and right breast ultrasound on 04/15/22 showed: a 1.4 x 0.5 x 0.8 cm complex solid and cystic mass in the 10 o'clock retroareolar right breast, as well as a 7 x 5 x 9 mm solid and cystic mass in the 10 o'clock right breast located 1 cmfn. No evidence of right axillary adenopathy was appreciated.      Biopsy of the 10 o'clock right breast mass located 1 cmfn on the date of 04/18/21 showed: low-grade DCIS measuring 5.5 mm in the greatest linear extent of the sample, with fibrocystic changes.  ER status: 100% positive and PR status 100% positive, both with strong staining intensity; Her2 not assessed. No lymph nodes were examined.  Biopsy of the 10 o'clock retroareolar right breast mass showed: focal atypical ductal hyperplasia, fibrocystic changes including stromal fibrosis, cystic dilation of the ducts, and usual duct hyperplasia with focal atypia  ***  PREVIOUS RADIATION THERAPY: No  PAST MEDICAL HISTORY:  has no past medical history on file.    PAST SURGICAL HISTORY: Past Surgical History:  Procedure Laterality Date   BREAST BIOPSY Right 04/18/2022   [place] RT BREAST BX W LOC DEV 1ST  LESION IMG BX SPEC US GUIDE 04/18/2022 [facility]   BREAST BIOPSY Right 04/18/2022   [place] RT BREAST BX W LOC DEV EA ADD LESION IMG BX SPEC US GUIDE 04/18/2022 [facility]    FAMILY HISTORY: family history includes Breast cancer in her maternal grandmother.  SOCIAL HISTORY:    ALLERGIES: Patient has no known allergies.  MEDICATIONS:  Current Outpatient Medications  Medication Sig Dispense Refill   estradiol (VIVELLE-DOT) 0.05 MG/24HR patch Vivelle-Dot 0.05 mg/24 hr transdermal patch     progesterone (PROMETRIUM) 200 MG capsule progesterone micronized 200 mg capsule     No current facility-administered medications for this encounter.    REVIEW OF SYSTEMS: As above in HPI.   PHYSICAL EXAM:  vitals were not taken for this visit.   General: Alert and oriented, in no acute distress HEENT: Head is normocephalic. Extraocular movements are intact. Oropharynx is clear. Neck: Neck is supple, no palpable cervical or supraclavicular lymphadenopathy. Heart: Regular in rate and rhythm with no murmurs, rubs, or gallops. Chest: Clear to auscultation bilaterally, with no rhonchi, wheezes, or rales. Abdomen: Soft, nontender, nondistended, with no rigidity or guarding. Extremities: No cyanosis or edema. Lymphatics: see Neck Exam Skin: No concerning lesions. Musculoskeletal: symmetric strength and muscle tone throughout. Neurologic: Cranial nerves II through XII are grossly intact. No obvious focalities. Speech is fluent. Coordination is intact. Psychiatric: Judgment and insight are intact. Affect is appropriate. Breasts: *** . No other palpable masses appreciated in the breasts or axillae *** .    ECOG = ***  0 - Asymptomatic (Fully active, able to carry on all predisease activities without restriction)  1 - Symptomatic but completely ambulatory (Restricted in physically strenuous activity but ambulatory and able to carry out work of a light or sedentary nature. For example, light  housework, office work)  2 - Symptomatic, <50% in bed during the day (Ambulatory and capable of all self care but unable to carry out any work activities. Up and about more than 50% of waking hours)  3 - Symptomatic, >50% in bed, but not bedbound (Capable of only limited self-care, confined to bed or chair 50% or more of waking hours)  4 - Bedbound (Completely disabled. Cannot carry on any self-care. Totally confined to bed or chair)  5 - Death   Eustace Pen MM, Creech RH, Tormey DC, et al. [phone]). "Toxicity and response criteria of the [facility]". [place] Oncol. 5 (6): 649-55   LABORATORY DATA:  No results found for: "WBC", "HGB", "HCT", "MCV", "PLT" CMP  No results found for: "NA", "K", "CL", "CO2", "GLUCOSE", "BUN", "CREATININE", "CALCIUM", "PROT", "ALBUMIN", "AST", "ALT", "ALKPHOS", "BILITOT", "GFRNONAA", "GFRAA"       RADIOGRAPHY: [place] RT BREAST BX W LOC DEV EA ADD LESION IMG BX SPEC US GUIDE  Addendum Date: 04/24/2022   ADDENDUM REPORT: 04/24/2022 13:43 ADDENDUM: Pathology revealed FOCAL ATYPICAL DUCTAL HYPERPLASIA, FIBROCYSTIC CHANGES INCLUDING STROMAL FIBROSIS, CYSTIC DILATATION OF DUCTS AND USUAL DUCT HYPERPLASIA WITH FOCAL ATYPIA of the RIGHT breast, 10:00 retroareolar, (ribbon clip). This was found to be concordant by Dr. Audie Pinto, with excision recommended. Pathology revealed LOW-GRADE DUCTAL CARCINOMA IN SITU, CRIBRIFORM AND PAPILLARY TYPES, FIBROCYSTIC CHANGES INCLUDING CYSTS DERMAL FIBROSIS AND CYSTIC DILATATION OF DUCTS of the RIGHT breast, 10:00 1cmfn, (coil clip). This was found to be concordant by Dr. Audie Pinto. Pathology results were discussed with the patient by telephone. The patient reported doing well after the biopsies with tenderness and swelling at the sites. Post biopsy instructions and care were reviewed and questions were answered. The patient was encouraged to call [facility] for any additional  concerns. My direct phone number was provided. The patient was referred to [facility] on May 08, 2022. Pathology results reported by Terie Purser, RN on 04/24/2022. Electronically Signed   By: Audie Pinto M.D.   On: 04/24/2022 13:43   Result Date: 04/24/2022 CLINICAL DATA:  67 year old female presenting for biopsy of masses in the right breast. EXAM: ULTRASOUND GUIDED RIGHT BREAST CORE NEEDLE BIOPSY COMPARISON:  Previous exam(s). PROCEDURE: I met with the patient and we discussed the procedure of ultrasound-guided biopsy, including benefits and alternatives. We discussed the high likelihood of a successful procedure. We discussed the risks of the procedure, including infection, bleeding, tissue injury, clip migration, and inadequate sampling. Informed written consent was given. The usual time-out protocol was performed immediately prior to the procedure. 1.  Lesion quadrant: Upper outer quadrant Using sterile technique and 1% Lidocaine as local anesthetic, under direct ultrasound visualization, a 14 gauge spring-loaded device was used to perform biopsy of a mass in the right breast at 10 o'clock retroareolar using a lateral approach. At the conclusion of the procedure a ribbon shaped tissue marker clip was deployed into the biopsy cavity. Follow up 2 view mammogram was performed and dictated separately. 2.  Lesion quadrant: Upper outer quadrant Using sterile technique and 1% Lidocaine as local anesthetic, under direct ultrasound visualization, a 14 gauge spring-loaded device was  used to perform biopsy of a mass in the right breast at 10 o'clock 1 cm from the nipple using a lateral approach. At the conclusion of the procedure a coil shaped tissue marker clip was deployed into the biopsy cavity. Follow up 2 view mammogram was performed and dictated separately. IMPRESSION: Ultrasound guided biopsy of masses in the right breast at 10  o'clock retroareolar and 10 o'clock 1 cm from the nipple. No apparent complications. Electronically Signed: By: Audie Pinto M.D. On: 04/18/2022 12:26  [place] RT BREAST BX W LOC DEV 1ST LESION IMG BX SPEC US GUIDE  Addendum Date: 04/24/2022   ADDENDUM REPORT: 04/24/2022 13:43 ADDENDUM: Pathology revealed FOCAL ATYPICAL DUCTAL HYPERPLASIA, FIBROCYSTIC CHANGES INCLUDING STROMAL FIBROSIS, CYSTIC DILATATION OF DUCTS AND USUAL DUCT HYPERPLASIA WITH FOCAL ATYPIA of the RIGHT breast, 10:00 retroareolar, (ribbon clip). This was found to be concordant by Dr. Audie Pinto, with excision recommended. Pathology revealed LOW-GRADE DUCTAL CARCINOMA IN SITU, CRIBRIFORM AND PAPILLARY TYPES, FIBROCYSTIC CHANGES INCLUDING CYSTS DERMAL FIBROSIS AND CYSTIC DILATATION OF DUCTS of the RIGHT breast, 10:00 1cmfn, (coil clip). This was found to be concordant by Dr. Audie Pinto. Pathology results were discussed with the patient by telephone. The patient reported doing well after the biopsies with tenderness and swelling at the sites. Post biopsy instructions and care were reviewed and questions were answered. The patient was encouraged to call [facility] for any additional concerns. My direct phone number was provided. The patient was referred to [facility] on May 08, 2022. Pathology results reported by Terie Purser, RN on 04/24/2022. Electronically Signed   By: Audie Pinto M.D.   On: 04/24/2022 13:43   Result Date: 04/24/2022 CLINICAL DATA:  67 year old female presenting for biopsy of masses in the right breast. EXAM: ULTRASOUND GUIDED RIGHT BREAST CORE NEEDLE BIOPSY COMPARISON:  Previous exam(s). PROCEDURE: I met with the patient and we discussed the procedure of ultrasound-guided biopsy, including benefits and alternatives. We discussed the high likelihood of a successful procedure. We discussed the risks of  the procedure, including infection, bleeding, tissue injury, clip migration, and inadequate sampling. Informed written consent was given. The usual time-out protocol was performed immediately prior to the procedure. 1.  Lesion quadrant: Upper outer quadrant Using sterile technique and 1% Lidocaine as local anesthetic, under direct ultrasound visualization, a 14 gauge spring-loaded device was used to perform biopsy of a mass in the right breast at 10 o'clock retroareolar using a lateral approach. At the conclusion of the procedure a ribbon shaped tissue marker clip was deployed into the biopsy cavity. Follow up 2 view mammogram was performed and dictated separately. 2.  Lesion quadrant: Upper outer quadrant Using sterile technique and 1% Lidocaine as local anesthetic, under direct ultrasound visualization, a 14 gauge spring-loaded device was used to perform biopsy of a mass in the right breast at 10 o'clock 1 cm from the nipple using a lateral approach. At the conclusion of the procedure a coil shaped tissue marker clip was deployed into the biopsy cavity. Follow up 2 view mammogram was performed and dictated separately. IMPRESSION: Ultrasound guided biopsy of masses in the right breast at 10 o'clock retroareolar and 10 o'clock 1 cm from the nipple. No apparent complications. Electronically Signed: By: Audie Pinto M.D. On: 04/18/2022 12:26  MM CLIP PLACEMENT RIGHT  Result Date: 04/18/2022 CLINICAL DATA:  Post procedure mammogram for clip placement EXAM: 3D DIAGNOSTIC RIGHT MAMMOGRAM POST ULTRASOUND BIOPSY  COMPARISON:  Previous exam(s). FINDINGS: 3D Mammographic images were obtained following ultrasound guided biopsy of a mass in the right breast at 10 o'clock retroareolar. The ribbon biopsy marking clip is in expected position at the site of biopsy. 3D Mammographic images were obtained following ultrasound guided biopsy of a mass in the right breast at 10 o'clock 1 cm from the nipple. The coil biopsy  marking clip is in expected position at the site of biopsy. IMPRESSION: Appropriate positioning of the ribbon shaped biopsy marking clip at the site of biopsy in the right breast at 10 o'clock retroareolar. Appropriate positioning of the coil shaped biopsy marking clip at the site of biopsy in the right breast at 10 o'clock 1 cm from the nipple. Final Assessment: Post Procedure Mammograms for Marker Placement Electronically Signed   By: Audie Pinto M.D.   On: 04/18/2022 12:23  MM DIAG BREAST TOMO UNI RIGHT  Result Date: 04/15/2022 CLINICAL DATA:  Patient recalled from screening for right breast mass EXAM: DIGITAL DIAGNOSTIC UNILATERAL RIGHT MAMMOGRAM WITH TOMOSYNTHESIS; ULTRASOUND RIGHT BREAST LIMITED TECHNIQUE: Right digital diagnostic mammography and breast tomosynthesis was performed.; Targeted ultrasound examination of the right breast was performed COMPARISON:  Previous exam(s). ACR Breast Density Category d: The breast tissue is extremely dense, which lowers the sensitivity of mammography. FINDINGS: Persistent partially obscured mass within the retroareolar upper-outer right breast, further evaluated with spot compression views. Targeted ultrasound is performed, showing a 1.4 x 0.5 x 0.8 cm complex solid and cystic mass right breast 10 o'clock position retroareolar location. This mass was not definitely identified on prior ultrasound exams of this region. Additionally within the right breast 10 o'clock position 1 cm from the nipple there is a persistent 7 x 5 x 9 mm solid and cystic mass. No right axillary adenopathy. IMPRESSION: 1. New complex mass right breast 10 o'clock position retroareolar location. 2. Persistent solid and cystic mass right breast 10 o'clock position 1 cm from the nipple. RECOMMENDATION: Ultrasound-guided core needle biopsy of the complex mass right breast 10 o'clock position retroareolar location. Ultrasound-guided core needle biopsy of the solid and cystic mass right breast 10  o'clock position 1 cm from the nipple. I have discussed the findings and recommendations with the patient. If applicable, a reminder letter will be sent to the patient regarding the next appointment. BI-RADS CATEGORY  4: Suspicious. Electronically Signed   By: Lovey Newcomer M.D.   On: 04/15/2022 11:44  US BREAST LTD UNI RIGHT INC AXILLA  Result Date: 04/15/2022 CLINICAL DATA:  Patient recalled from screening for right breast mass EXAM: DIGITAL DIAGNOSTIC UNILATERAL RIGHT MAMMOGRAM WITH TOMOSYNTHESIS; ULTRASOUND RIGHT BREAST LIMITED TECHNIQUE: Right digital diagnostic mammography and breast tomosynthesis was performed.; Targeted ultrasound examination of the right breast was performed COMPARISON:  Previous exam(s). ACR Breast Density Category d: The breast tissue is extremely dense, which lowers the sensitivity of mammography. FINDINGS: Persistent partially obscured mass within the retroareolar upper-outer right breast, further evaluated with spot compression views. Targeted ultrasound is performed, showing a 1.4 x 0.5 x 0.8 cm complex solid and cystic mass right breast 10 o'clock position retroareolar location. This mass was not definitely identified on prior ultrasound exams of this region. Additionally within the right breast 10 o'clock position 1 cm from the nipple there is a persistent 7 x 5 x 9 mm solid and cystic mass. No right axillary adenopathy. IMPRESSION: 1. New complex mass right breast 10 o'clock position retroareolar location. 2. Persistent solid and cystic mass right breast 10 o'clock position  1 cm from the nipple. RECOMMENDATION: Ultrasound-guided core needle biopsy of the complex mass right breast 10 o'clock position retroareolar location. Ultrasound-guided core needle biopsy of the solid and cystic mass right breast 10 o'clock position 1 cm from the nipple. I have discussed the findings and recommendations with the patient. If applicable, a reminder letter will be sent to the patient regarding the  next appointment. BI-RADS CATEGORY  4: Suspicious. Electronically Signed   By: Lovey Newcomer M.D.   On: 04/15/2022 11:44     IMPRESSION/PLAN: ***   It was a pleasure meeting the patient today. We discussed the risks, benefits, and side effects of radiotherapy. I recommend radiotherapy to the *** to reduce her risk of locoregional recurrence by 2/3.  We discussed that radiation would take approximately *** weeks to complete and that I would give the patient a few weeks to heal following surgery before starting treatment planning. *** If chemotherapy were to be given, this would precede radiotherapy. We spoke about acute effects including skin irritation and fatigue as well as much less common late effects including internal organ injury or irritation. We spoke about the latest technology that is used to minimize the risk of late effects for patients undergoing radiotherapy to the breast or chest wall. No guarantees of treatment were given. The patient is enthusiastic about proceeding with treatment. I look forward to participating in the patient's care.  I will await her referral back to me for postoperative follow-up and eventual CT simulation/treatment planning.  On date of service, in total, I spent *** minutes on this encounter. Patient was seen in person.   __________________________________________   Eppie Gibson, MD  This document serves as a record of services personally performed by Eppie Gibson, MD. It was created on her behalf by Roney Mans, a trained medical scribe. The creation of this record is based on the scribe's personal observations and the provider's statements to them. This document has been checked and approved by the attending provider.

## 2022-05-08 ENCOUNTER — Inpatient Hospital Stay (HOSPITAL_BASED_OUTPATIENT_CLINIC_OR_DEPARTMENT_OTHER): Payer: Medicare Other | Admitting: Genetic Counselor

## 2022-05-08 ENCOUNTER — Inpatient Hospital Stay: Payer: Medicare Other | Admitting: Licensed Clinical Social Worker

## 2022-05-08 ENCOUNTER — Ambulatory Visit: Payer: Self-pay | Admitting: Surgery

## 2022-05-08 ENCOUNTER — Encounter: Payer: Self-pay | Admitting: Hematology and Oncology

## 2022-05-08 ENCOUNTER — Inpatient Hospital Stay: Payer: Medicare Other | Attending: Hematology and Oncology

## 2022-05-08 ENCOUNTER — Ambulatory Visit: Payer: Medicare Other | Admitting: Physical Therapy

## 2022-05-08 ENCOUNTER — Inpatient Hospital Stay (HOSPITAL_BASED_OUTPATIENT_CLINIC_OR_DEPARTMENT_OTHER): Payer: Medicare Other | Admitting: Hematology and Oncology

## 2022-05-08 ENCOUNTER — Encounter: Payer: Medicare Other | Admitting: Genetic Counselor

## 2022-05-08 ENCOUNTER — Encounter: Payer: Self-pay | Admitting: *Deleted

## 2022-05-08 ENCOUNTER — Ambulatory Visit
Admission: RE | Admit: 2022-05-08 | Discharge: 2022-05-08 | Disposition: A | Discharge status: Home / Self Care | Payer: Medicare Other | Source: Ambulatory Visit | Attending: Radiation Oncology | Admitting: Radiation Oncology

## 2022-05-08 ENCOUNTER — Encounter: Payer: Self-pay | Admitting: Radiation Oncology

## 2022-05-08 VITALS — BP 157/101 | HR 95 | Temp 97.7°F | Resp 18 | Wt 104.2 lb

## 2022-05-08 DIAGNOSIS — M8580 Other specified disorders of bone density and structure, unspecified site: Secondary | ICD-10-CM | POA: Diagnosis not present

## 2022-05-08 DIAGNOSIS — D0511 Intraductal carcinoma in situ of right breast: Secondary | ICD-10-CM

## 2022-05-08 DIAGNOSIS — Z803 Family history of malignant neoplasm of breast: Secondary | ICD-10-CM

## 2022-05-08 LAB — CBC WITH DIFFERENTIAL (CANCER CENTER ONLY)
Abs Immature Granulocytes: 0.02 10*3/uL (ref 0.00–0.07)
Basophils Absolute: 0 10*3/uL (ref 0.0–0.1)
Basophils Relative: 0 %
Eosinophils Absolute: 0.1 10*3/uL (ref 0.0–0.5)
Eosinophils Relative: 1 %
HCT: 43.2 % (ref 36.0–46.0)
Hemoglobin: 14.7 g/dL (ref 12.0–15.0)
Immature Granulocytes: 0 %
Lymphocytes Relative: 24 %
Lymphs Abs: 1.6 10*3/uL (ref 0.7–4.0)
MCH: 29.9 pg (ref 26.0–34.0)
MCHC: 34 g/dL (ref 30.0–36.0)
MCV: 88 fL (ref 80.0–100.0)
Monocytes Absolute: 0.4 10*3/uL (ref 0.1–1.0)
Monocytes Relative: 6 %
Neutro Abs: 4.4 10*3/uL (ref 1.7–7.7)
Neutrophils Relative %: 69 %
Platelet Count: 192 10*3/uL (ref 150–400)
RBC: 4.91 MIL/uL (ref 3.87–5.11)
RDW: 12.5 % (ref 11.5–15.5)
WBC Count: 6.4 10*3/uL (ref 4.0–10.5)
nRBC: 0 % (ref 0.0–0.2)

## 2022-05-08 LAB — CMP (CANCER CENTER ONLY)
ALT: 11 U/L (ref 0–44)
AST: 18 U/L (ref 15–41)
Albumin: 4.4 g/dL (ref 3.5–5.0)
Alkaline Phosphatase: 50 U/L (ref 38–126)
Anion gap: 5 (ref 5–15)
BUN: 18 mg/dL (ref 8–23)
CO2: 31 mmol/L (ref 22–32)
Calcium: 9.7 mg/dL (ref 8.9–10.3)
Chloride: 103 mmol/L (ref 98–111)
Creatinine: 0.85 mg/dL (ref 0.44–1.00)
GFR, Estimated: >60 mL/min (ref >60)
Glucose, Bld: 108 mg/dL — ABNORMAL HIGH (ref 70–99)
Potassium: 3.8 mmol/L (ref 3.5–5.1)
Sodium: 139 mmol/L (ref 135–145)
Total Bilirubin: 0.7 mg/dL (ref 0.3–1.2)
Total Protein: 7 g/dL (ref 6.5–8.1)

## 2022-05-08 LAB — GENETIC SCREENING ORDER

## 2022-05-08 NOTE — Progress Notes (Signed)
Annette Cannon  Initial Assessment   Annette Cannon is a 67 y.o. year old female accompanied by husband. Clinical Social Cannon was referred by  [facility]  for assessment of psychosocial needs.   SDOH (Social Determinants of Health) assessments performed: Yes SDOH Interventions    Flowsheet Row Clinical Support from 05/08/2022 in [place] at [facility]  SDOH Interventions   Food Insecurity Interventions Intervention Not Indicated  Housing Interventions Intervention Not Indicated  Transportation Interventions Intervention Not Indicated  Utilities Interventions Intervention Not Indicated  Financial Strain Interventions Intervention Not Indicated       SDOH Screenings   Food Insecurity: No Food Insecurity (05/08/2022)  Housing: Low Risk  (05/08/2022)  Transportation Needs: No Transportation Needs (05/08/2022)  Utilities: Not At Risk (05/08/2022)  Financial Resource Strain: Low Risk  (05/08/2022)  Tobacco Use: Unknown (05/08/2022)     Distress Screen completed: Yes    05/08/2022   12:07 PM  ONCBCN DISTRESS SCREENING  Screening Type Initial Screening  Distress experienced in past week (1-10) 0  Information Concerns Type Lack of info about diagnosis;Lack of info about treatment      Family/Social Information:  Housing Arrangement: patient lives with husband, Annette Cannon . 3 adult children outside the home Family members/support persons in your life? Family and Friends Transportation concerns: no  Employment: Retired.  Income source: Paediatric nurse concerns: No Type of concern: None Food access concerns: no Services Currently in place:  Medicare + [employer] supplement  Coping/ Adjustment to diagnosis: Patient understands treatment plan and what happens next? yes, opted for bilateral mastectomies Concerns about diagnosis and/or treatment: I'm not especially worried about anything.  Hopes and/or priorities: Wants to be healed  enough to see new grandchild in May and continue travel plans Patient enjoys time with family/ friends and travel Current coping skills/ strengths: Capable of independent living , Armed forces logistics/support/administrative officer , and Supportive family/friends     SUMMARY: Current SDOH Barriers:  No significant barriers noted today  Clinical Social Cannon Clinical Goal(s):  No clinical social Cannon goals at this time  Interventions: Discussed common feeling and emotions when being diagnosed with cancer, and the importance of support during treatment Informed patient of the support team roles and support services at [facility] Provided [place] contact information and encouraged patient to call with any questions or concerns   Follow Up Plan: Patient will contact CSW with any support or resource needs Patient verbalizes understanding of plan: Yes    Annette Cannon E Annette Procter, LCSW

## 2022-05-08 NOTE — Assessment & Plan Note (Signed)
This is a very pleasant 67 year old female patient with a newly diagnosed right breast DCIS, low-grade, ER/PR strongly positive referred to breast [place] for additional recommendations.  Pathology review: I discussed with the patient the difference between DCIS and invasive breast cancer. It is considered a precancerous lesion. DCIS is classified as a Stage 0 breast cancer. It is generally detected through mammograms as calcifications. We also discussed the importance of ER and PR receptors and their implications to adjuvant treatment options. Prognosis of DCIS dependence on grade and degree of comedo necrosis. It is anticipated that if not treated, 20-30% of DCIS can develop into invasive breast cancer.  Recommendation:  1. Breast conserving surgery 2. Followed by adjuvant radiation therapy 3. Followed by antiestrogen therapy with tamoxifen/aromatase inhibitors based on menopausal status  5 years  However she mentioned that she most likely will undergo bilateral mastectomy hence there is no clear role of antiestrogens at this time.  Although she may have nipple sparing mastectomy, I do not believe she will require antiestrogens in the adjuvant setting.  We have discussed this clearly today.  We can see her for surveillance in the breast clinic about once a year for follow-up.

## 2022-05-08 NOTE — Progress Notes (Signed)
REFERRING PROVIDER: Benay Pike, MD  PRIMARY PROVIDER:  Mckinley Jewel, MD  PRIMARY REASON FOR VISIT:  1. Ductal carcinoma in situ (DCIS) of right breast   2. Family history of breast cancer     HISTORY OF PRESENT ILLNESS:   Annette Cannon, a 67 y.o. female, was seen for a [place] cancer genetics consultation at the request of Dr. Chryl Heck due to a personal and family history of cancer.  Annette Cannon presents to clinic today to discuss the possibility of a hereditary predisposition to cancer, to discuss genetic testing, and to further clarify her future cancer risks, as well as potential cancer risks for family members.   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  CANCER HISTORY:  Oncology History  Ductal carcinoma in situ (DCIS) of right breast  04/04/2022 Mammogram   Bilateral screening mammogram shows possible right breast mass warranting further evaluation.  In the left breast no findings suspicious for malignancy.  Diagnostic mammogram showed new complex mass right breast 10 o'clock position retroareolar location, persistent solid and cystic mass right breast 10 o'clock position 1 cm from the nipple   04/18/2022 Pathology Results   Right breast needle core biopsy at 10:00 showed focal atypical ductal hyperplasia, fibrocystic changes including stromal fibrosis, cystic dilatation of ducts and usual ductal hyperplasia with focal atypia, negative for microcalcifications.  Right breast needle core biopsy at 10:00 shows low-grade DCIS, negative for invasive carcinoma.  Prognostics on the DCIS showed ER 100% positive strong staining PR 100% positive strong staining   05/06/2022 Initial Diagnosis   Ductal carcinoma in situ (DCIS) of right breast      RISK FACTORS:  Menarche was at age 80.  First live birth at age 78.  OCP use for approximately 2 years.  Ovaries intact: yes.  Uterus intact: yes.   Menopausal status: postmenopausal.  HRT use: 10 years. Colonoscopy: yes;  2016 . Mammogram within the last year: yes. Any excessive radiation exposure in the past: no  No past medical history on file.  Past Surgical History:  Procedure Laterality Date   BREAST BIOPSY Right 04/18/2022   [place] RT BREAST BX W LOC DEV 1ST LESION IMG BX SPEC US GUIDE 04/18/2022 [facility]   BREAST BIOPSY Right 04/18/2022   [place] RT BREAST BX W LOC DEV EA ADD LESION IMG BX SPEC US GUIDE 04/18/2022 [facility]   TONSILECTOMY, ADENOIDECTOMY, BILATERAL MYRINGOTOMY AND TUBES      Social History   Socioeconomic History   Marital status: Married    Spouse name: Not on file   Number of children: Not on file   Years of education: Not on file   Highest education level: Not on file  Occupational History   Not on file  Tobacco Use   Smoking status: Never   Smokeless tobacco: Not on file  Substance and Sexual Activity   Alcohol use: Yes    Comment: occase   Drug use: Never   Sexual activity: Not on file  Other Topics Concern   Not on file  Social History Narrative   Not on file   Social Determinants of Health   Financial Resource Strain: Low Risk  (05/08/2022)   Overall Financial Resource Strain (CARDIA)    Difficulty of Paying Living Expenses: Not hard at all  Food Insecurity: No Food Insecurity (05/08/2022)   Hunger Vital Sign    Worried About Running Out  of Food in the Last Year: Never true    [place] in the Last Year: Never true  Transportation Needs: No Transportation Needs (05/08/2022)   PRAPARE - Hydrologist (Medical): No    Lack of Transportation (Non-Medical): No  Physical Activity: Not on file  Stress: Not on file  Social Connections: Not on file     FAMILY HISTORY:  We obtained a detailed, 4-generation family history.  Significant diagnoses are listed below: Family History  Problem Relation Age of Onset   Breast cancer Maternal  Grandmother       Annette Cannon's maternal grandmother was diagnosed with breast cancer at an unknown age, she is deceased. Annette Cannon is unaware of previous family history of genetic testing for hereditary cancer risks. There is no reported Ashkenazi Jewish ancestry.   GENETIC COUNSELING ASSESSMENT: Annette Cannon is a 67 y.o. female with a personal and family history of cancer which is somewhat suggestive of a hereditary predisposition to cancer. We, therefore, discussed and recommended the following at today's visit.   DISCUSSION: We discussed that 5 - 10% of cancer is hereditary, with most cases of breast cancer associated with BRCA1/2.  There are other genes that can be associated with hereditary breast cancer syndromes.  We discussed that testing is beneficial for several reasons including knowing how to follow individuals after completing their treatment, identifying whether potential treatment options would be beneficial, and understanding if other family members could be at risk for cancer and allowing them to undergo genetic testing.   We reviewed the characteristics, features and inheritance patterns of hereditary cancer syndromes. We also discussed genetic testing, including the appropriate family members to test, the process of testing, insurance coverage and turn-around-time for results. We discussed the implications of a negative, positive, carrier and/or variant of uncertain significant result. We recommended Annette Cannon pursue genetic testing for a panel that includes genes associated with breast cancer.   Annette Cannon  was offered a common hereditary cancer panel (48 genes) and an expanded pan-cancer panel (70 genes). Annette Cannon was informed of the benefits and limitations of each panel, including that expanded pan-cancer panels contain genes that do not have clear management guidelines at this point in time.  We also discussed that as the number of genes included on a panel increases, the chances of  variants of uncertain significance increases. After considering the benefits and limitations of each gene panel, Annette Cannon elected to have [facility] Multi-Cancer Panel.  The Multi-Cancer + RNA Panel offered by [facility] includes sequencing and/or deletion/duplication analysis of the following 70 genes:  AIP*, ALK, APC*, ATM*, AXIN2*, BAP1*, BARD1*, BLM*, BMPR1A*, BRCA1*, BRCA2*, BRIP1*, CDC73*, CDH1*, CDK4, CDKN1B*, CDKN2A, CHEK2*, CTNNA1*, DICER1*, EPCAM (del/dup only), EGFR, FH*, FLCN*, GREM1 (promoter dup only), HOXB13, KIT, LZTR1, MAX*, MBD4, MEN1*, MET, MITF, MLH1*, MSH2*, MSH3*, MSH6*, MUTYH*, NF1*, NF2*, NTHL1*, PALB2*, PDGFRA, PMS2*, POLD1*, POLE*, POT1*, PRKAR1A*, PTCH1*, PTEN*, RAD51C*, RAD51D*, RB1*, RET, SDHA* (sequencing only), SDHAF2*, SDHB*, SDHC*, SDHD*, SMAD4*, SMARCA4*, SMARCB1*, SMARCE1*, STK11*, SUFU*, TMEM127*, TP53*, TSC1*, TSC2*, VHL*. RNA analysis is performed for * genes.   Based on Ms. Mcelveen's personal and family history of cancer, she doesn't meet NCCN criteria for genetic testing. Based on her insurance, she should not have an out of pocket cost.    PLAN: After considering the risks, benefits, and limitations, Annette Cannon provided informed consent to pursue genetic testing and the blood sample was sent to [facility] for analysis of the  Multi-Cancer Panel. Results should be available within approximately 2-3 weeks' time, at which point they will be disclosed by telephone to Annette Cannon, as will any additional recommendations warranted by these results. Annette Cannon will receive a summary of her genetic counseling visit and a copy of her results once available. This information will also be available in Epic.   Annette Cannon's questions were answered to her satisfaction today. Our contact information was provided should additional questions or concerns arise. Thank you for the referral and allowing [place] to share in the care of your patient.   Lucille Passy, MS, [facility] Genetic  Counselor [email or web address] (P) [phone]  The patient was seen for a total of 20 minutes in face-to-face genetic counseling.  The patient brought her husband. Drs. Lindi Adie and/or Burr Medico were available to discuss this case as needed.   _______________________________________________________________________ For Office Staff:  Number of people involved in session: 2 Was an Intern/ student involved with case: no

## 2022-05-08 NOTE — Progress Notes (Signed)
[place] NOTE  Patient Care Team: Mckinley Jewel, MD as PCP - General (Internal Medicine) Erroll Luna, MD as Consulting Physician (General Surgery) Benay Pike, MD as Consulting Physician (Hematology and Oncology) Eppie Gibson, MD as Attending Physician (Radiation Oncology) Mauro Kaufmann, RN as Oncology Nurse Navigator Rockwell Germany, RN as Oncology Nurse Navigator  CHIEF COMPLAINTS/PURPOSE OF CONSULTATION:  Newly diagnosed breast cancer  HISTORY OF PRESENTING ILLNESS:  Annette Cannon 67 y.o. female is here because of recent diagnosis of right  Breast DCIS  I reviewed her records extensively and collaborated the history with the patient.  SUMMARY OF ONCOLOGIC HISTORY: Oncology History  Ductal carcinoma in situ (DCIS) of right breast  04/04/2022 Mammogram   Bilateral screening mammogram shows possible right breast mass warranting further evaluation.  In the left breast no findings suspicious for malignancy.  Diagnostic mammogram showed new complex mass right breast 10 o'clock position retroareolar location, persistent solid and cystic mass right breast 10 o'clock position 1 cm from the nipple   04/18/2022 Pathology Results   Right breast needle core biopsy at 10:00 showed focal atypical ductal hyperplasia, fibrocystic changes including stromal fibrosis, cystic dilatation of ducts and usual ductal hyperplasia with focal atypia, negative for microcalcifications.  Right breast needle core biopsy at 10:00 shows low-grade DCIS, negative for invasive carcinoma.  Prognostics on the DCIS showed ER 100% positive strong staining PR 100% positive strong staining   05/06/2022 Initial Diagnosis   Ductal carcinoma in situ (DCIS) of right breast    Ms. Annette Cannon is here for an initial visit with her husband.  She is very healthy at baseline, denies using any regular medications except for estrogen and progesterone supplement for the past 12 years.  She  apparently was recommended this to help with aging and to help with keeping the memory active.  She also has some underlying osteopenia.  She discontinued the estrogen and progesterone supplementation already.  She denies any major health issues today. No known family history of breast cancer.  Rest of the pertinent 10 point ROS reviewed and negative  MEDICAL HISTORY:  No past medical history on file.  SURGICAL HISTORY: Past Surgical History:  Procedure Laterality Date   BREAST BIOPSY Right 04/18/2022   [place] RT BREAST BX W LOC DEV 1ST LESION IMG BX SPEC US GUIDE 04/18/2022 [facility]   BREAST BIOPSY Right 04/18/2022   [place] RT BREAST BX W LOC DEV EA ADD LESION IMG BX SPEC US GUIDE 04/18/2022 [facility]   TONSILECTOMY, ADENOIDECTOMY, BILATERAL MYRINGOTOMY AND TUBES      SOCIAL HISTORY: Social History   Socioeconomic History   Marital status: Married    Spouse name: Not on file   Number of children: Not on file   Years of education: Not on file   Highest education level: Not on file  Occupational History   Not on file  Tobacco Use   Smoking status: Never   Smokeless tobacco: Not on file  Substance and Sexual Activity   Alcohol use: Yes    Comment: occase   Drug use: Never   Sexual activity: Not on file  Other Topics Concern   Not on file  Social History Narrative   Not on file   Social Determinants of Health   Financial Resource Strain: Not on file  Food Insecurity: Not on file  Transportation Needs: Not on file  Physical Activity: Not on file  Stress: Not on file  Social Connections: Not on file  Intimate Partner Violence: Not on file    FAMILY HISTORY: Family History  Problem Relation Age of Onset   Breast cancer Maternal Grandmother     ALLERGIES:  has No Known Allergies.  MEDICATIONS:  Current Outpatient Medications  Medication Sig Dispense Refill   estradiol (VIVELLE-DOT) 0.05 MG/24HR patch Vivelle-Dot 0.05 mg/24 hr transdermal patch      progesterone (PROMETRIUM) 200 MG capsule progesterone micronized 200 mg capsule     No current facility-administered medications for this visit.    REVIEW OF SYSTEMS:   Constitutional: Denies fevers, chills or abnormal night sweats Eyes: Denies blurriness of vision, double vision or watery eyes Ears, nose, mouth, throat, and face: Denies mucositis or sore throat Respiratory: Denies cough, dyspnea or wheezes Cardiovascular: Denies palpitation, chest discomfort or lower extremity swelling Gastrointestinal:  Denies nausea, heartburn or change in bowel habits Skin: Denies abnormal skin rashes Lymphatics: Denies new lymphadenopathy or easy bruising Neurological:Denies numbness, tingling or new weaknesses Behavioral/Psych: Mood is stable, no new changes  Breast: Denies any palpable lumps or discharge All other systems were reviewed with the patient and are negative.  PHYSICAL EXAMINATION: ECOG PERFORMANCE STATUS: 0 - Asymptomatic  Vitals:   05/08/22 0831  BP: (!) 157/101  Pulse: 95  Resp: 18  Temp: 97.7 F (36.5 C)  SpO2: 100%   Filed Weights   05/08/22 0831  Weight: 104 lb 4 oz (47.3 kg)    GENERAL:alert, no distress and comfortable Bilateral breasts inspected.  Postbiopsy changes with some palpable abnormalities in the right breast along with some palpable cysts in the left breast.  She tells me that the left palpable cyst is chronic.  Very small nipples. No lower extremity edema.  No cervical adenopathy.  LABORATORY DATA:  I have reviewed the data as listed Lab Results  Component Value Date   WBC 6.4 05/08/2022   HGB 14.7 05/08/2022   HCT 43.2 05/08/2022   MCV 88.0 05/08/2022   PLT 192 05/08/2022   Lab Results  Component Value Date   NA 139 05/08/2022   K 3.8 05/08/2022   CL 103 05/08/2022   CO2 31 05/08/2022    RADIOGRAPHIC STUDIES: I have personally reviewed the radiological reports and agreed with the findings in the report.  ASSESSMENT AND PLAN:  Ductal  carcinoma in situ (DCIS) of right breast This is a very pleasant 67 year old female patient with a newly diagnosed right breast DCIS, low-grade, ER/PR strongly positive referred to breast [place] for additional recommendations.  Pathology review: I discussed with the patient the difference between DCIS and invasive breast cancer. It is considered a precancerous lesion. DCIS is classified as a Stage 0 breast cancer. It is generally detected through mammograms as calcifications. We also discussed the importance of ER and PR receptors and their implications to adjuvant treatment options. Prognosis of DCIS dependence on grade and degree of comedo necrosis. It is anticipated that if not treated, 20-30% of DCIS can develop into invasive breast cancer.  Recommendation:  1. Breast conserving surgery 2. Followed by adjuvant radiation therapy 3. Followed by antiestrogen therapy with tamoxifen/aromatase inhibitors based on menopausal status  5 years  However she mentioned that she most likely will undergo bilateral mastectomy hence there is no clear role of antiestrogens at this time.  Although she may have nipple sparing mastectomy, I do not believe she will require antiestrogens in the adjuvant setting.  We have discussed this clearly today.  We can see her for surveillance in the breast clinic about once a  year for follow-up.  Total time spent: 45 minutes including history, physical exam, review of records, counseling and coordination of care All questions were answered. The patient knows to call the clinic with any problems, questions or concerns.    Benay Pike, MD 05/08/22

## 2022-05-09 ENCOUNTER — Telehealth: Payer: Self-pay | Admitting: *Deleted

## 2022-05-09 ENCOUNTER — Encounter: Payer: Self-pay | Admitting: *Deleted

## 2022-05-09 NOTE — Telephone Encounter (Signed)
Spoke with patient to follow up from [facility] 1/31 and assess navigation needs. She is requesting Derry Lory, NP be added to her care team.  Added her per patient. Patient denies any other questions or concerns. Encouraged her to call should anything arise. Patient verbalized undertsanding.

## 2022-05-14 ENCOUNTER — Encounter: Payer: Self-pay | Admitting: *Deleted

## 2022-05-20 ENCOUNTER — Encounter: Payer: Self-pay | Admitting: Genetic Counselor

## 2022-05-20 DIAGNOSIS — Z1379 Encounter for other screening for genetic and chromosomal anomalies: Secondary | ICD-10-CM | POA: Insufficient documentation

## 2022-05-21 ENCOUNTER — Telehealth: Payer: Self-pay | Admitting: Genetic Counselor

## 2022-05-21 ENCOUNTER — Encounter (HOSPITAL_BASED_OUTPATIENT_CLINIC_OR_DEPARTMENT_OTHER): Payer: Self-pay | Admitting: Surgery

## 2022-05-21 NOTE — Telephone Encounter (Signed)
I contacted Ms. Kurtzman to discuss her genetic testing results. No pathogenic variants were identified in the 70 genes analyzed. Of note, a variant of uncertain significance was identified in the ATM gene. Detailed clinic note to follow.  The test report has been scanned into EPIC and is located under the Molecular Pathology section of the Results Review tab.  A portion of the result report is included below for reference.   Lucille Passy, MS, [facility] Genetic Counselor [email or web address] (P) [phone]

## 2022-05-22 NOTE — Progress Notes (Signed)
Ensure presurgery drink given with instruction to complete by 0945. CHG soap also given; written instruction given for both; pt verbalized understanding.     Enhanced Recovery after Surgery for Orthopedics Enhanced Recovery after Surgery is a protocol used to improve the stress on your body and your recovery after surgery.  Patient Instructions  The night before surgery:  No food after midnight. ONLY clear liquids after midnight  The day of surgery (if you do NOT have diabetes):  Drink ONE (1) Pre-Surgery Clear Ensure as directed.   This drink was given to you during your hospital  pre-op appointment visit. The pre-op nurse will instruct you on the time to drink the  Pre-Surgery Ensure depending on your surgery time. Finish the drink at the designated time by the pre-op nurse.  Nothing else to drink after completing the  Pre-Surgery Clear Ensure.  The day of surgery (if you have diabetes): Drink ONE (1) Gatorade 2 (G2) as directed. This drink was given to you during your hospital  pre-op appointment visit.  The pre-op nurse will instruct you on the time to drink the   Gatorade 2 (G2) depending on your surgery time. Color of the Gatorade may vary. Red is not allowed. Nothing else to drink after completing the  Gatorade 2 (G2).         If you have questions, please contact your surgeon's office.

## 2022-05-28 ENCOUNTER — Encounter: Payer: Self-pay | Admitting: Genetic Counselor

## 2022-05-28 ENCOUNTER — Ambulatory Visit: Payer: Self-pay | Admitting: Genetic Counselor

## 2022-05-28 DIAGNOSIS — Z1379 Encounter for other screening for genetic and chromosomal anomalies: Secondary | ICD-10-CM

## 2022-05-28 NOTE — Progress Notes (Signed)
HPI:   Annette Cannon was previously seen in the [facility] due to a personal and family history of cancer and concerns regarding a hereditary predisposition to cancer. Please refer to our prior cancer genetics clinic note for more information regarding our discussion, assessment and recommendations, at the time. Ms. Canul's recent genetic test results were disclosed to her, as were recommendations warranted by these results. These results and recommendations are discussed in more detail below.  CANCER HISTORY:  Oncology History  Ductal carcinoma in situ (DCIS) of right breast  04/04/2022 Mammogram   Bilateral screening mammogram shows possible right breast mass warranting further evaluation.  In the left breast no findings suspicious for malignancy.  Diagnostic mammogram showed new complex mass right breast 10 o'clock position retroareolar location, persistent solid and cystic mass right breast 10 o'clock position 1 cm from the nipple   04/18/2022 Pathology Results   Right breast needle core biopsy at 10:00 showed focal atypical ductal hyperplasia, fibrocystic changes including stromal fibrosis, cystic dilatation of ducts and usual ductal hyperplasia with focal atypia, negative for microcalcifications.  Right breast needle core biopsy at 10:00 shows low-grade DCIS, negative for invasive carcinoma.  Prognostics on the DCIS showed ER 100% positive strong staining PR 100% positive strong staining   05/06/2022 Initial Diagnosis   Ductal carcinoma in situ (DCIS) of right breast    Genetic Testing   [facility] Multi-Cancer Panel+RNA was Negative. Of note, a variant of uncertain significance was detected in the ATM gene (c.[id number]). Report date is 05/15/2022.  The Multi-Cancer + RNA Panel offered by [facility] includes sequencing and/or deletion/duplication analysis of the following 70 genes:  AIP*, ALK, APC*, ATM*, AXIN2*, BAP1*, BARD1*, BLM*, BMPR1A*, BRCA1*, BRCA2*, BRIP1*, CDC73*, CDH1*,  CDK4, CDKN1B*, CDKN2A, CHEK2*, CTNNA1*, DICER1*, EPCAM (del/dup only), EGFR, FH*, FLCN*, GREM1 (promoter dup only), HOXB13, KIT, LZTR1, MAX*, MBD4, MEN1*, MET, MITF, MLH1*, MSH2*, MSH3*, MSH6*, MUTYH*, NF1*, NF2*, NTHL1*, PALB2*, PDGFRA, PMS2*, POLD1*, POLE*, POT1*, PRKAR1A*, PTCH1*, PTEN*, RAD51C*, RAD51D*, RB1*, RET, SDHA* (sequencing only), SDHAF2*, SDHB*, SDHC*, SDHD*, SMAD4*, SMARCA4*, SMARCB1*, SMARCE1*, STK11*, SUFU*, TMEM127*, TP53*, TSC1*, TSC2*, VHL*. RNA analysis is performed for * genes.     FAMILY HISTORY:  We obtained a detailed, 4-generation family history.  Significant diagnoses are listed below:      Family History  Problem Relation Age of Onset   Breast cancer Maternal Grandmother           Ms. Sobh's maternal grandmother was diagnosed with breast cancer at an unknown age, she is deceased. Ms. Aegerter is unaware of previous family history of genetic testing for hereditary cancer risks. There is no reported Ashkenazi Jewish ancestry.   GENETIC TEST RESULTS:  The [facility] Multi-Cancer Panel found no pathogenic mutations.   The Multi-Cancer + RNA Panel offered by [facility] includes sequencing and/or deletion/duplication analysis of the following 70 genes:  AIP*, ALK, APC*, ATM*, AXIN2*, BAP1*, BARD1*, BLM*, BMPR1A*, BRCA1*, BRCA2*, BRIP1*, CDC73*, CDH1*, CDK4, CDKN1B*, CDKN2A, CHEK2*, CTNNA1*, DICER1*, EPCAM (del/dup only), EGFR, FH*, FLCN*, GREM1 (promoter dup only), HOXB13, KIT, LZTR1, MAX*, MBD4, MEN1*, MET, MITF, MLH1*, MSH2*, MSH3*, MSH6*, MUTYH*, NF1*, NF2*, NTHL1*, PALB2*, PDGFRA, PMS2*, POLD1*, POLE*, POT1*, PRKAR1A*, PTCH1*, PTEN*, RAD51C*, RAD51D*, RB1*, RET, SDHA* (sequencing only), SDHAF2*, SDHB*, SDHC*, SDHD*, SMAD4*, SMARCA4*, SMARCB1*, SMARCE1*, STK11*, SUFU*, TMEM127*, TP53*, TSC1*, TSC2*, VHL*. RNA analysis is performed for * genes.   The test report has been scanned into EPIC and is located under the Molecular Pathology section of the Results Review tab.  A  portion  of the result report is included below for reference. Genetic testing reported out on 05/15/2022.       Genetic testing identified a variant of uncertain significance (VUS) in the ATM gene called c.7969A>G.  At this time, it is unknown if this variant is associated with an increased risk for cancer or if it is benign, but most uncertain variants are reclassified to benign. It should not be used to make medical management decisions. With time, we suspect the laboratory will determine the significance of this variant, if any. If the laboratory reclassifies this variant, we will attempt to contact Ms. Mcarthy to discuss it further.   Even though a pathogenic variant was not identified, possible explanations for the cancer in the family may include: There may be no hereditary risk for cancer in the family. The cancers in Ms. Gilmore and/or her family may be due to other genetic or environmental factors. There may be a gene mutation in one of these genes that current testing methods cannot detect, but that chance is small. There could be another gene that has not yet been discovered, or that we have not yet tested, that is responsible for the cancer diagnoses in the family.  The variant of uncertain significance detected in the ATM gene may be reclassified as a pathogenic variant in the future. At this time, we do not know if this variant increases the risk for cancer.  Therefore, it is important to remain in touch with cancer genetics in the future so that we can continue to offer Ms. Creason the most up to date genetic testing.   ADDITIONAL GENETIC TESTING:  We discussed with Ms. Bouffard that her genetic testing was fairly extensive.  If there are genes identified to increase cancer risk that can be analyzed in the future, we would be happy to discuss and coordinate this testing at that time.    CANCER SCREENING RECOMMENDATIONS:  Ms. Hillesheim's test result is considered negative (normal).  This means that we  have not identified a hereditary cause for her personal and family history of cancer at this time. Most cancers happen by chance and this negative test suggests that her cancer may fall into this category.    An individual's cancer risk and medical management are not determined by genetic test results alone. Overall cancer risk assessment incorporates additional factors, including personal medical history, family history, and any available genetic information that may result in a personalized plan for cancer prevention and surveillance. Therefore, it is recommended she continue to follow the cancer management and screening guidelines provided by her oncology and primary healthcare provider.  RECOMMENDATIONS FOR FAMILY MEMBERS:   Since she did not inherit a mutation in a cancer predisposition gene included on this panel, her children could not have inherited a mutation from her in one of these genes. Individuals in this family might be at some increased risk of developing cancer, over the general population risk, due to the family history of cancer. We recommend women in this family have a yearly mammogram beginning at age 75, or 48 years younger than the earliest onset of cancer, an annual clinical breast exam, and perform monthly breast self-exams. We do not recommend familial testing for the ATM variant of uncertain significance (VUS).  FOLLOW-UP:  Cancer genetics is a rapidly advancing field and it is possible that new genetic tests will be appropriate for her and/or her family members in the future. We encouraged her to remain in contact with cancer  genetics on an annual basis so we can update her personal and family histories and let her know of advances in cancer genetics that may benefit this family.   Our contact number was provided. Ms. Knee's questions were answered to her satisfaction, and she knows she is welcome to call us at anytime with additional questions or concerns.   Lucille Passy, MS, [facility] Genetic Counselor [email or web address] (P) [phone]

## 2022-05-29 ENCOUNTER — Other Ambulatory Visit: Payer: Self-pay

## 2022-05-29 ENCOUNTER — Ambulatory Visit (HOSPITAL_BASED_OUTPATIENT_CLINIC_OR_DEPARTMENT_OTHER): Payer: Medicare Other | Admitting: Anesthesiology

## 2022-05-29 ENCOUNTER — Encounter (HOSPITAL_BASED_OUTPATIENT_CLINIC_OR_DEPARTMENT_OTHER): Payer: Self-pay | Admitting: Surgery

## 2022-05-29 ENCOUNTER — Encounter (HOSPITAL_BASED_OUTPATIENT_CLINIC_OR_DEPARTMENT_OTHER)
Admission: RE | Disposition: A | Discharge status: Home / Self Care | Payer: Self-pay | Source: Home / Self Care | Attending: Surgery

## 2022-05-29 ENCOUNTER — Ambulatory Visit (HOSPITAL_BASED_OUTPATIENT_CLINIC_OR_DEPARTMENT_OTHER)
Admission: RE | Admit: 2022-05-29 | Discharge: 2022-05-30 | Disposition: A | Discharge status: Home / Self Care | Payer: Medicare Other | Attending: Surgery | Admitting: Surgery

## 2022-05-29 DIAGNOSIS — N62 Hypertrophy of breast: Secondary | ICD-10-CM | POA: Diagnosis not present

## 2022-05-29 DIAGNOSIS — D0511 Intraductal carcinoma in situ of right breast: Secondary | ICD-10-CM | POA: Insufficient documentation

## 2022-05-29 DIAGNOSIS — N6022 Fibroadenosis of left breast: Secondary | ICD-10-CM | POA: Insufficient documentation

## 2022-05-29 DIAGNOSIS — Z01818 Encounter for other preprocedural examination: Secondary | ICD-10-CM

## 2022-05-29 HISTORY — PX: SIMPLE MASTECTOMY WITH AXILLARY SENTINEL NODE BIOPSY: SHX6098

## 2022-05-29 SURGERY — SIMPLE MASTECTOMY
Anesthesia: Regional | Site: Breast | Laterality: Bilateral

## 2022-05-29 MED ORDER — ONDANSETRON HCL 4 MG/2ML IJ SOLN: 4.0000 mg | INTRAMUSCULAR | Status: DC | PRN

## 2022-05-29 MED ORDER — ACETAMINOPHEN 500 MG PO TABS
ORAL_TABLET | ORAL | Status: AC
  Filled 2022-05-29: qty 2

## 2022-05-29 MED ORDER — SODIUM CHLORIDE 0.9 % IV SOLN
INTRAVENOUS | Status: DC | PRN
  Administered 2022-05-29: 500 mL

## 2022-05-29 MED ORDER — METHOCARBAMOL 500 MG PO TABS
500.0000 mg | ORAL_TABLET | Freq: Four times a day (QID) | ORAL | Status: DC | PRN
  Administered 2022-05-29: 500 mg via ORAL
  Filled 2022-05-29: qty 1

## 2022-05-29 MED ORDER — ACETAMINOPHEN 325 MG PO TABS
650.0000 mg | ORAL_TABLET | ORAL | Status: DC | PRN
  Administered 2022-05-29: 650 mg via ORAL
  Filled 2022-05-29: qty 2

## 2022-05-29 MED ORDER — CHLORHEXIDINE GLUCONATE CLOTH 2 % EX PADS: 6.0000 | MEDICATED_PAD | Freq: Once | CUTANEOUS | Status: DC

## 2022-05-29 MED ORDER — DEXTROSE-NACL 5-0.9 % IV SOLN: INTRAVENOUS | Status: DC

## 2022-05-29 MED ORDER — DEXAMETHASONE SODIUM PHOSPHATE 10 MG/ML IJ SOLN
INTRAMUSCULAR | Status: AC
  Filled 2022-05-29: qty 1

## 2022-05-29 MED ORDER — PHENYLEPHRINE 80 MCG/ML (10ML) SYRINGE FOR IV PUSH (FOR BLOOD PRESSURE SUPPORT)
PREFILLED_SYRINGE | INTRAVENOUS | Status: AC
  Filled 2022-05-29: qty 20

## 2022-05-29 MED ORDER — OXYCODONE HCL 5 MG PO TABS
5.0000 mg | ORAL_TABLET | ORAL | Status: DC | PRN
  Administered 2022-05-29: 5 mg via ORAL
  Administered 2022-05-29 – 2022-05-30 (×2): 10 mg via ORAL
  Filled 2022-05-29: qty 1
  Filled 2022-05-29 (×2): qty 2

## 2022-05-29 MED ORDER — ONDANSETRON HCL 4 MG/2ML IJ SOLN: 4.0000 mg | Freq: Once | INTRAMUSCULAR | Status: DC | PRN

## 2022-05-29 MED ORDER — ONDANSETRON HCL 4 MG/2ML IJ SOLN
INTRAMUSCULAR | Status: AC
  Filled 2022-05-29: qty 2

## 2022-05-29 MED ORDER — ACETAMINOPHEN 325 MG PO TABS
ORAL_TABLET | ORAL | Status: AC
  Filled 2022-05-29: qty 2

## 2022-05-29 MED ORDER — ONDANSETRON HCL 4 MG/2ML IJ SOLN
INTRAMUSCULAR | Status: DC | PRN
  Administered 2022-05-29: 4 mg via INTRAVENOUS

## 2022-05-29 MED ORDER — TRANEXAMIC ACID 1000 MG/10ML IV SOLN
INTRAVENOUS | Status: DC | PRN
  Administered 2022-05-29: 3000 mg via TOPICAL

## 2022-05-29 MED ORDER — MORPHINE SULFATE (PF) 4 MG/ML IV SOLN: 1.0000 mg | INTRAVENOUS | Status: DC | PRN

## 2022-05-29 MED ORDER — MAGTRACE LYMPHATIC TRACER
INTRAMUSCULAR | Status: DC | PRN
  Administered 2022-05-29: 2 mL via INTRAMUSCULAR

## 2022-05-29 MED ORDER — BUPIVACAINE HCL (PF) 0.5 % IJ SOLN
INTRAMUSCULAR | Status: DC | PRN
  Administered 2022-05-29 (×2): 10 mL via PERINEURAL

## 2022-05-29 MED ORDER — IBUPROFEN 800 MG PO TABS: 800.0000 mg | ORAL_TABLET | Freq: Three times a day (TID) | ORAL | 0 refills | Status: DC | PRN

## 2022-05-29 MED ORDER — EPHEDRINE 5 MG/ML INJ
INTRAVENOUS | Status: AC
  Filled 2022-05-29: qty 5

## 2022-05-29 MED ORDER — DEXAMETHASONE SODIUM PHOSPHATE 4 MG/ML IJ SOLN
INTRAMUSCULAR | Status: DC | PRN
  Administered 2022-05-29: 8 mg via INTRAVENOUS

## 2022-05-29 MED ORDER — FENTANYL CITRATE (PF) 100 MCG/2ML IJ SOLN
INTRAMUSCULAR | Status: AC
  Filled 2022-05-29: qty 2

## 2022-05-29 MED ORDER — TRANEXAMIC ACID 1000 MG/10ML IV SOLN
INTRAVENOUS | Status: AC
  Filled 2022-05-29: qty 20

## 2022-05-29 MED ORDER — KETOROLAC TROMETHAMINE 15 MG/ML IJ SOLN
15.0000 mg | Freq: Once | INTRAMUSCULAR | Status: AC | PRN
  Administered 2022-05-29: 15 mg via INTRAVENOUS
  Filled 2022-05-29: qty 1

## 2022-05-29 MED ORDER — SODIUM CHLORIDE 0.9% FLUSH
3.0000 mL | Freq: Two times a day (BID) | INTRAVENOUS | Status: DC
  Administered 2022-05-29: 3 mL via INTRAVENOUS

## 2022-05-29 MED ORDER — OXYCODONE HCL 5 MG PO TABS: 5.0000 mg | ORAL_TABLET | Freq: Four times a day (QID) | ORAL | 0 refills | Status: DC | PRN

## 2022-05-29 MED ORDER — BUPIVACAINE HCL (PF) 0.25 % IJ SOLN
INTRAMUSCULAR | Status: DC | PRN
  Administered 2022-05-29 (×2): 10 mL via PERINEURAL

## 2022-05-29 MED ORDER — LIDOCAINE HCL (CARDIAC) PF 100 MG/5ML IV SOSY
PREFILLED_SYRINGE | INTRAVENOUS | Status: DC | PRN
  Administered 2022-05-29: 40 mg via INTRAVENOUS

## 2022-05-29 MED ORDER — SODIUM CHLORIDE 0.9 % IV SOLN: 250.0000 mL | INTRAVENOUS | Status: DC | PRN

## 2022-05-29 MED ORDER — LACTATED RINGERS IV SOLN: INTRAVENOUS | Status: DC

## 2022-05-29 MED ORDER — SODIUM CHLORIDE 0.9 % IV SOLN
INTRAVENOUS | Status: AC
  Filled 2022-05-29: qty 10

## 2022-05-29 MED ORDER — PROPOFOL 10 MG/ML IV BOLUS
INTRAVENOUS | Status: DC | PRN
  Administered 2022-05-29: 150 mg via INTRAVENOUS

## 2022-05-29 MED ORDER — LIDOCAINE 2% (20 MG/ML) 5 ML SYRINGE
INTRAMUSCULAR | Status: AC
  Filled 2022-05-29: qty 5

## 2022-05-29 MED ORDER — MIDAZOLAM HCL 2 MG/2ML IJ SOLN
2.0000 mg | Freq: Once | INTRAMUSCULAR | Status: AC
  Administered 2022-05-29: 2 mg via INTRAVENOUS

## 2022-05-29 MED ORDER — FENTANYL CITRATE (PF) 100 MCG/2ML IJ SOLN
INTRAMUSCULAR | Status: DC | PRN
  Administered 2022-05-29: 25 ug via INTRAVENOUS
  Administered 2022-05-29 (×2): 50 ug via INTRAVENOUS

## 2022-05-29 MED ORDER — CEFAZOLIN SODIUM-DEXTROSE 2-4 GM/100ML-% IV SOLN
2.0000 g | INTRAVENOUS | Status: AC
  Administered 2022-05-29: 2 g via INTRAVENOUS

## 2022-05-29 MED ORDER — MIDAZOLAM HCL 2 MG/2ML IJ SOLN
INTRAMUSCULAR | Status: AC
  Filled 2022-05-29: qty 2

## 2022-05-29 MED ORDER — SODIUM CHLORIDE 0.9% FLUSH: 3.0000 mL | INTRAVENOUS | Status: DC | PRN

## 2022-05-29 MED ORDER — ACETAMINOPHEN 325 MG RE SUPP: 650.0000 mg | RECTAL | Status: DC | PRN

## 2022-05-29 MED ORDER — AMISULPRIDE (ANTIEMETIC) 5 MG/2ML IV SOLN: 10.0000 mg | Freq: Once | INTRAVENOUS | Status: DC | PRN

## 2022-05-29 MED ORDER — KETOROLAC TROMETHAMINE 15 MG/ML IJ SOLN
INTRAMUSCULAR | Status: AC
  Filled 2022-05-29: qty 1

## 2022-05-29 MED ORDER — ACETAMINOPHEN 500 MG PO TABS: 1000.0000 mg | ORAL_TABLET | ORAL | Status: DC

## 2022-05-29 MED ORDER — CEFAZOLIN SODIUM-DEXTROSE 2-4 GM/100ML-% IV SOLN
INTRAVENOUS | Status: AC
  Filled 2022-05-29: qty 100

## 2022-05-29 MED ORDER — FENTANYL CITRATE (PF) 100 MCG/2ML IJ SOLN: 25.0000 ug | INTRAMUSCULAR | Status: DC | PRN

## 2022-05-29 MED ORDER — PHENYLEPHRINE 80 MCG/ML (10ML) SYRINGE FOR IV PUSH (FOR BLOOD PRESSURE SUPPORT)
PREFILLED_SYRINGE | INTRAVENOUS | Status: DC | PRN
  Administered 2022-05-29 (×2): 80 ug via INTRAVENOUS

## 2022-05-29 MED ORDER — ACETAMINOPHEN 325 MG PO TABS
650.0000 mg | ORAL_TABLET | Freq: Once | ORAL | Status: AC
  Administered 2022-05-29: 650 mg via ORAL

## 2022-05-29 SURGICAL SUPPLY — 70 items
ADH SKN CLS APL DERMABOND .7 (GAUZE/BANDAGES/DRESSINGS) ×2
APL PRP STRL LF DISP 70% ISPRP (MISCELLANEOUS) ×1
APL SKNCLS STERI-STRIP NONHPOA (GAUZE/BANDAGES/DRESSINGS)
APPLIER CLIP 11 MED OPEN (CLIP)
APPLIER CLIP 9.375 MED OPEN (MISCELLANEOUS)
APR CLP MED 11 20 MLT OPN (CLIP)
APR CLP MED 9.3 20 MLT OPN (MISCELLANEOUS)
BENZOIN TINCTURE PRP APPL 2/3 (GAUZE/BANDAGES/DRESSINGS) IMPLANT
BINDER BREAST LRG (GAUZE/BANDAGES/DRESSINGS) IMPLANT
BINDER BREAST MEDIUM (GAUZE/BANDAGES/DRESSINGS) IMPLANT
BINDER BREAST XLRG (GAUZE/BANDAGES/DRESSINGS) IMPLANT
BINDER BREAST XXLRG (GAUZE/BANDAGES/DRESSINGS) IMPLANT
BIOPATCH RED 1 DISK 7.0 (GAUZE/BANDAGES/DRESSINGS) IMPLANT
BLADE HEX COATED 2.75 (ELECTRODE) ×2 IMPLANT
BLADE SURG 10 STRL SS (BLADE) ×2 IMPLANT
BLADE SURG 15 STRL LF DISP TIS (BLADE) ×2 IMPLANT
BLADE SURG 15 STRL SS (BLADE) ×1
BNDG CMPR 5X62 HK CLSR LF (GAUZE/BANDAGES/DRESSINGS)
BNDG CMPR 6"X 5 YARDS HK CLSR (GAUZE/BANDAGES/DRESSINGS)
BNDG ELASTIC 6INX 5YD STR LF (GAUZE/BANDAGES/DRESSINGS) IMPLANT
CANISTER SUCT 1200ML W/VALVE (MISCELLANEOUS) ×2 IMPLANT
CHLORAPREP W/TINT 26 (MISCELLANEOUS) ×2 IMPLANT
CLIP APPLIE 11 MED OPEN (CLIP) IMPLANT
CLIP APPLIE 9.375 MED OPEN (MISCELLANEOUS) IMPLANT
COVER BACK TABLE 60X90IN (DRAPES) ×2 IMPLANT
COVER MAYO STAND STRL (DRAPES) ×2 IMPLANT
DERMABOND ADVANCED .7 DNX12 (GAUZE/BANDAGES/DRESSINGS) ×4 IMPLANT
DRAIN CHANNEL 19F RND (DRAIN) ×2 IMPLANT
DRAPE LAPAROSCOPIC ABDOMINAL (DRAPES) ×2 IMPLANT
DRAPE UTILITY XL STRL (DRAPES) ×2 IMPLANT
DRSG TEGADERM 2-3/8X2-3/4 SM (GAUZE/BANDAGES/DRESSINGS) IMPLANT
DRSG TEGADERM 4X10 (GAUZE/BANDAGES/DRESSINGS) IMPLANT
ELECT REM PT RETURN 9FT ADLT (ELECTROSURGICAL) ×1
ELECTRODE REM PT RTRN 9FT ADLT (ELECTROSURGICAL) ×2 IMPLANT
EVACUATOR SILICONE 100CC (DRAIN) ×2 IMPLANT
GAUZE PAD ABD 8X10 STRL (GAUZE/BANDAGES/DRESSINGS) IMPLANT
GAUZE SPONGE 4X4 12PLY STRL LF (GAUZE/BANDAGES/DRESSINGS) IMPLANT
GLOVE BIOGEL PI IND STRL 8 (GLOVE) ×2 IMPLANT
GLOVE ECLIPSE 8.0 STRL XLNG CF (GLOVE) ×2 IMPLANT
GOWN STRL REUS W/ TWL LRG LVL3 (GOWN DISPOSABLE) ×4 IMPLANT
GOWN STRL REUS W/ TWL XL LVL3 (GOWN DISPOSABLE) ×2 IMPLANT
GOWN STRL REUS W/TWL LRG LVL3 (GOWN DISPOSABLE) ×2
GOWN STRL REUS W/TWL XL LVL3 (GOWN DISPOSABLE) ×1
NDL HYPO 25X1 1.5 SAFETY (NEEDLE) IMPLANT
NEEDLE HYPO 25X1 1.5 SAFETY (NEEDLE) IMPLANT
NS IRRIG 1000ML POUR BTL (IV SOLUTION) ×2 IMPLANT
PACK BASIN DAY SURGERY FS (CUSTOM PROCEDURE TRAY) ×2 IMPLANT
PENCIL SMOKE EVACUATOR (MISCELLANEOUS) ×2 IMPLANT
PIN SAFETY STERILE (MISCELLANEOUS) ×2 IMPLANT
SLEEVE SCD COMPRESS KNEE MED (STOCKING) ×2 IMPLANT
SPIKE FLUID TRANSFER (MISCELLANEOUS) IMPLANT
SPONGE T-LAP 18X18 ~~LOC~~+RFID (SPONGE) ×4 IMPLANT
SPONGE T-LAP 4X18 ~~LOC~~+RFID (SPONGE) IMPLANT
STAPLER VISISTAT 35W (STAPLE) ×2 IMPLANT
STRIP CLOSURE SKIN 1/2X4 (GAUZE/BANDAGES/DRESSINGS) IMPLANT
SUT CHROMIC 3 0 SH 27 (SUTURE) IMPLANT
SUT ETHILON 2 0 FS 18 (SUTURE) ×2 IMPLANT
SUT MNCRL AB 3-0 PS2 18 (SUTURE) ×2 IMPLANT
SUT MON AB 4-0 PC3 18 (SUTURE) IMPLANT
SUT SILK 2 0 PERMA HAND 18 BK (SUTURE) IMPLANT
SUT SILK 2 0 SH (SUTURE) IMPLANT
SUT VIC AB 3-0 54X BRD REEL (SUTURE) ×2 IMPLANT
SUT VIC AB 3-0 BRD 54 (SUTURE) ×1
SUT VICRYL 3-0 CR8 SH (SUTURE) ×2 IMPLANT
SYR CONTROL 10ML LL (SYRINGE) IMPLANT
TOWEL GREEN STERILE FF (TOWEL DISPOSABLE) ×4 IMPLANT
TRACER MAGTRACE VIAL (MISCELLANEOUS) IMPLANT
TUBE CONNECTING 20X1/4 (TUBING) ×2 IMPLANT
UNDERPAD 30X36 HEAVY ABSORB (UNDERPADS AND DIAPERS) IMPLANT
YANKAUER SUCT BULB TIP NO VENT (SUCTIONS) ×2 IMPLANT

## 2022-05-29 NOTE — H&P (Signed)
History of Present Illness: Annette Cannon is a 67 y.o. female who is seen today as an office consultation for evaluation of Breast Cancer  Patient seen today in the [facility]. Recent mammogram showed density in the right breast. Core biopsy showed low-grade DCIS but also fibrocystic changes as well. She also had atypical ductal hyperplasia as well. She had 2 biopsies done. History of dense breast tissue. No history of significant pain but she has had cysts in the past.  Review of Systems: A complete review of systems was obtained from the patient. I have reviewed this information and discussed as appropriate with the patient. See HPI as well for other ROS.    Medical History: History reviewed. No pertinent past medical history.  Patient Active Problem List  Diagnosis  Ductal carcinoma in situ (DCIS) of right breast  Menopause present  Rash  Nasal congestion  Blood in stool   Past Surgical History:  Procedure Laterality Date  .Breast biopsy Right 04/18/2022    No Known Allergies  Current Outpatient Medications on File Prior to Visit  Medication Sig Dispense Refill  triamcinolone 0.1 % cream Apply 1 Application topically 2 (two) times daily Apply topically 2 times daily   No current facility-administered medications on file prior to visit.   Family History  Family history unknown: Yes    Social History   Tobacco Use  Smoking Status Never  Smokeless Tobacco Never    Social History   Socioeconomic History  Marital status: Married  Tobacco Use  Smoking status: Never  Smokeless tobacco: Never  Vaping Use  Vaping Use: Never used  Substance and Sexual Activity  Alcohol use: Yes  Comment: 1-2 glasses, not often  Drug use: Never   Objective:  There were no vitals filed for this visit.  There is no height or weight on file to calculate BMI.  Physical Exam HENT:  Head: Normocephalic.  Cardiovascular:  Rate and Rhythm: Normal rate.  Pulmonary:  Effort: Pulmonary  effort is normal.  Chest:  Breasts: Right: Normal.  Left: Normal.  Comments: Very small breast bilaterally and dense   Post biopsy change right  Musculoskeletal:  Cervical back: Normal range of motion.  Lymphadenopathy:  Upper Body:  Right upper body: No supraclavicular or axillary adenopathy.  Left upper body: No supraclavicular or axillary adenopathy.  Skin: General: Skin is warm.  Neurological:  General: No focal deficit present.  Psychiatric:  Mood and Affect: Mood normal.  Behavior: Behavior normal.     Labs, Imaging and Diagnostic Testing:  Diagnosis 1. Breast, right, needle core biopsy, 10:00 retroareolar, ribbon clip FOCAL ATYPICAL DUCTAL HYPERPLASIA FIBROCYSTIC CHANGES INCLUDING STROMAL FIBROSIS, CYSTIC DILATATION OF DUCTS AND USUAL DUCT HYPERPLASIA WITH FOCAL ATYPIA NEGATIVE FOR MICROCALCIFICATIONS 2. Breast, right, needle core biopsy, 10:00 1cmfn, coil clip LOW-GRADE DUCTAL CARCINOMA IN SITU, CRIBRIFORM AND PAPILLARY TYPES NEGATIVE FOR INVASIVE CARCINOMA FIBROCYSTIC CHANGES INCLUDING CYSTS DERMAL FIBROSIS AND CYSTIC DILATATION OF DUCTS DCIS MEASURES 5.5 MM IN GREATEST LINEAR EXTENT Microscopic Comment 1. -2. Immunohistochemical stains for estrogen receptor and cytokeratin 5/6 performed on both cores with adequate controls show diffuse staining for estrogen receptor with loss of cytokeratin 5/6 within the right breast biopsies 1 cm from the nipple (specimen 2). Within the retroareolar biopsy (specimen 1) these changes are only focal. No invasive tumor is seen. Tobin Chad MD Pathologist, Electronic Signature (Case signed 04/22/2022)  ADDENDUM: Pathology revealed FOCAL ATYPICAL DUCTAL HYPERPLASIA, FIBROCYSTIC CHANGES INCLUDING STROMAL FIBROSIS, CYSTIC DILATATION OF DUCTS AND USUAL DUCT HYPERPLASIA WITH FOCAL ATYPIA  of the RIGHT breast, 10:00 retroareolar, (ribbon clip). This was found to be concordant by Dr. Audie Pinto, with excision  recommended.  Pathology revealed LOW-GRADE DUCTAL CARCINOMA IN SITU, CRIBRIFORM AND PAPILLARY TYPES, FIBROCYSTIC CHANGES INCLUDING CYSTS DERMAL FIBROSIS AND CYSTIC DILATATION OF DUCTS of the RIGHT breast, 10:00 1cmfn, (coil clip). This was found to be concordant by Dr. Audie Pinto.  Pathology results were discussed with the patient by telephone. The patient reported doing well after the biopsies with tenderness and swelling at the sites. Post biopsy instructions and care were reviewed and questions were answered. The patient was encouraged to call [facility] for any additional concerns. My direct phone number was provided.  The patient was referred to [facility] on May 08, 2022.  Assessment and Plan:   Diagnoses and all orders for this visit:  Ductal carcinoma in situ (DCIS) of right breast   Discussed options with patient today. Reviewed mastectomy with or without reconstruction and breast conserving surgery. Given her small breast size and fibrocystic issues she has had throughout her life, she desires bilateral mastectomies to treat her DCIS but also to reduce her lifetime risk given her breast density and the limitations of this on screening going forward. She would like to preserve her nipple but does not reconstruction. Given her small breast size I think cosmetically this would work in her circumstance. Risks and benefits of this discussed with the patient. Risk of bleeding, infection, skin loss, nipple loss, need for revision of surgical procedure and/or potentially lymph node mapping. Plan on using mag trace during procedure.   Kennieth Francois, MD

## 2022-05-29 NOTE — Anesthesia Preprocedure Evaluation (Addendum)
Anesthesia Evaluation  Patient identified by MRN, date of birth, ID band Patient awake    Reviewed: Allergy & Precautions, NPO status , Patient's Chart, lab work & pertinent test results  Airway Mallampati: I  TM Distance: >3 FB Neck ROM: Full    Dental no notable dental hx.    Pulmonary neg pulmonary ROS   Pulmonary exam normal        Cardiovascular negative cardio ROS Normal cardiovascular exam     Neuro/Psych negative neurological ROS  negative psych ROS   GI/Hepatic negative GI ROS, Neg liver ROS,,,  Endo/Other  negative endocrine ROS    Renal/GU negative Renal ROS     Musculoskeletal negative musculoskeletal ROS (+)    Abdominal   Peds  Hematology negative hematology ROS (+)   Anesthesia Other Findings RIGHT BREAST DCIS  Reproductive/Obstetrics                             Anesthesia Physical Anesthesia Plan  ASA: 1  Anesthesia Plan: General and Regional   Post-op Pain Management: Regional block*   Induction: Intravenous  PONV Risk Score and Plan: 3 and Ondansetron, Dexamethasone, Midazolam and Treatment may vary due to age or medical condition  Airway Management Planned: LMA  Additional Equipment:   Intra-op Plan:   Post-operative Plan: Extubation in OR  Informed Consent: I have reviewed the patients History and Physical, chart, labs and discussed the procedure including the risks, benefits and alternatives for the proposed anesthesia with the patient or authorized representative who has indicated his/her understanding and acceptance.     Dental advisory given  Plan Discussed with: CRNA  Anesthesia Plan Comments:        Anesthesia Quick Evaluation

## 2022-05-29 NOTE — Interval H&P Note (Signed)
History and Physical Interval Note:  05/29/2022 12:13 PM  Annette Cannon  has presented today for surgery, with the diagnosis of RIGHT BREAST DCIS.  The various methods of treatment have been discussed with the patient and family. After consideration of risks, benefits and other options for treatment, the patient has consented to  Procedure(s): BILATERAL NIPPLE SPARING MASTECTOMY (Bilateral) as a surgical intervention.  The patient's history has been reviewed, patient examined, no change in status, stable for surgery.  I have reviewed the patient's chart and labs.  Questions were answered to the patient's satisfaction.   Pt has decided against keeping her nipples so plan simple mastectomy bilaterally   Marcello Moores A Diontae Route

## 2022-05-29 NOTE — Anesthesia Procedure Notes (Signed)
Anesthesia Regional Block: Pectoralis block   Pre-Anesthetic Checklist: , timeout performed,  Correct Patient, Correct Site, Correct Laterality,  Correct Procedure, Correct Position, site marked,  Risks and benefits discussed,  Surgical consent,  Pre-op evaluation,  At surgeon's request and post-op pain management  Laterality: Right  Prep: chloraprep       Needles:  Injection technique: Single-shot  Needle Type: Echogenic Stimulator Needle     Needle Length: 9cm  Needle Gauge: 21     Additional Needles:   Procedures:,,,, ultrasound used (permanent image in chart),,    Narrative:  Start time: 05/29/2022 1:20 PM End time: 05/29/2022 1:30 PM Injection made incrementally with aspirations every 5 mL.  Performed by: Personally  Anesthesiologist: Murvin Natal, MD  Additional Notes: Functioning IV was confirmed and monitors were applied.  A timeout was performed. Sterile prep, hand hygiene and sterile gloves were used. A 50m 21ga Arrow echogenic stimulator needle was used. Negative aspiration and negative test dose prior to incremental administration of local anesthetic. The patient tolerated the procedure well.  Ultrasound guidance: relevent anatomy identified, needle position confirmed, local anesthetic spread visualized around nerve(s), vascular puncture avoided.  Image printed for medical record.

## 2022-05-29 NOTE — Transfer of Care (Signed)
Immediate Anesthesia Transfer of Care Note  Patient: Annette Cannon  Procedure(s) Performed: SIMPLE MASTECTOMY (Bilateral: Breast)  Patient Location: PACU  Anesthesia Type:GA combined with regional for post-op pain  Level of Consciousness: sedated  Airway & Oxygen Therapy: Patient Spontanous Breathing and Patient connected to face mask oxygen  Post-op Assessment: Report given to RN and Post -op Vital signs reviewed and stable  Post vital signs: Reviewed and stable  Last Vitals:  Vitals Value Taken Time  BP 146/89 05/29/22 1618  Temp    Pulse 80 05/29/22 1619  Resp 11 05/29/22 1619  SpO2 100 % 05/29/22 1619  Vitals shown include unvalidated device data.  Last Pain:  Vitals:   05/29/22 1219  TempSrc: Oral  PainSc: 0-No pain      Patients Stated Pain Goal: 4 ([id number])  Complications: No notable events documented.

## 2022-05-29 NOTE — Discharge Instructions (Signed)
[facility] [phone]  MASTECTOMY: POST OP INSTRUCTIONS  Always review your discharge instruction sheet given to you by the facility where your surgery was performed. IF YOU HAVE DISABILITY OR FAMILY LEAVE FORMS, YOU MUST BRING THEM TO THE OFFICE FOR PROCESSING.   DO NOT GIVE THEM TO YOUR DOCTOR. A prescription for pain medication may be given to you upon discharge.  Take your pain medication as prescribed, if needed.  If narcotic pain medicine is not needed, then you may take acetaminophen (Tylenol) or ibuprofen (Advil) as needed. Take your usually prescribed medications unless otherwise directed. If you need a refill on your pain medication, please contact your pharmacy.  They will contact our office to request authorization.  Prescriptions will not be filled after 5pm or on week-ends. You should follow a light diet the first few days after arrival home, such as soup and crackers, etc.  Resume your normal diet the day after surgery. Most patients will experience some swelling and bruising on the chest and underarm.  Ice packs will help.  Swelling and bruising can take several days to resolve.  It is common to experience some constipation if taking pain medication after surgery.  Increasing fluid intake and taking a stool softener (such as Colace) will usually help or prevent this problem from occurring.  A mild laxative (Milk of Magnesia or Miralax) should be taken according to package instructions if there are no bowel movements after 48 hours. Unless discharge instructions indicate otherwise, leave your bandage dry and in place until your next appointment in 3-5 days.  You may take a limited sponge bath.  No tube baths or showers until the drains are removed.  You may have steri-strips (small skin tapes) in place directly over the incision.  These strips should be left on the skin for 7-10 days.  If your surgeon used skin glue on the incision, you may shower in 24 hours.   The glue will flake off over the next 2-3 weeks.  Any sutures or staples will be removed at the office during your follow-up visit. DRAINS:  If you have drains in place, it is important to keep a list of the amount of drainage produced each day in your drains.  Before leaving the hospital, you should be instructed on drain care.  Call our office if you have any questions about your drains. ACTIVITIES:  You may resume regular (light) daily activities beginning the next day--such as daily self-care, walking, climbing stairs--gradually increasing activities as tolerated.  You may have sexual intercourse when it is comfortable.  Refrain from any heavy lifting or straining until approved by your doctor. You may drive when you are no longer taking prescription pain medication, you can comfortably wear a seatbelt, and you can safely maneuver your car and apply brakes. RETURN TO WORK:  __________________________________________________________ You should see your doctor in the office for a follow-up appointment approximately 3-5 days after your surgery.  Your doctor's nurse will typically make your follow-up appointment when she calls you with your pathology report.  Expect your pathology report 2-3 business days after your surgery.  You may call to check if you do not hear from us after three days.   OTHER INSTRUCTIONS: ______________________________________________________________________________________________ ____________________________________________________________________________________________ WHEN TO CALL YOUR DOCTOR: Fever over 101.0 Nausea and/or vomiting Extreme swelling or bruising Continued bleeding from incision. Increased pain, redness, or drainage from the incision. The clinic staff is available to answer your questions during regular business hours.  Please don't hesitate   to call and ask to speak to one of the nurses for clinical concerns.  If you have a medical emergency, go to the  nearest emergency room or call 911.  A surgeon from [facility] is always on call at the hospital. [address] [place], [place], [place]  [address] ? [address], [place], [place]   [address] [phone] ? [phone] ? FAX [phone] Web site: [email or web address]   About my Jackson-Pratt Bulb Drain  What is a Jackson-Pratt bulb? A Jackson-Pratt is a soft, round device used to collect drainage. It is connected to a long, thin drainage catheter, which is held in place by one or two small stiches near your surgical incision site. When the bulb is squeezed, it forms a vacuum, forcing the drainage to empty into the bulb.  Emptying the Jackson-Pratt bulb- To empty the bulb: 1. Release the plug on the top of the bulb. 2. Pour the bulb's contents into a measuring container which your nurse will provide. 3. Record the time emptied and amount of drainage. Empty the drain(s) as often as your     doctor or nurse recommends.  Date                  Time                    Amount (Drain 1)                 Amount (Drain 2)  _____________________________________________________________________  _____________________________________________________________________  _____________________________________________________________________  _____________________________________________________________________  _____________________________________________________________________  _____________________________________________________________________  _____________________________________________________________________  _____________________________________________________________________  Squeezing the Jackson-Pratt Bulb- To squeeze the bulb: 1. Make sure the plug at the top of the bulb is open. 2. Squeeze the bulb tightly in your fist. You will hear air squeezing from the bulb. 3. Replace the plug while the bulb is squeezed. 4. Use a safety pin to attach the bulb to your clothing.  This will keep the catheter from     pulling at the bulb insertion site.  When to call your doctor- Call your doctor if: Drain site becomes red, swollen or hot. You have a fever greater than 101 degrees F. There is oozing at the drain site. Drain falls out (apply a guaze bandage over the drain hole and secure it with tape). Drainage increases daily not related to activity patterns. (You will usually have more drainage when you are active than when you are resting.) Drainage has a bad odor.

## 2022-05-29 NOTE — Op Note (Signed)
Preoperative diagnosis: Right breast DCIS  Postoperative diagnosis: Same  Procedure: Right simple mastectomy with injection of mag trace and left risk reducing simple mastectomy  Surgeon: Erroll Luna, MD   Assistant: Abran Richard PA   Anesthesia: LMA with bilateral pectoral blocks per anesthesia  EBL: 75 cc  Drains: 19 round to both mastectomy sites  Specimen: Bilateral breast tissue.  Imaging of right breast showed biopsy clips in the specimen  IV fluids: Per anesthesia record  Indications for procedure: The patient is a 67 year old female with right breast DCIS.  She was seen and desired mastectomy.  We did discuss potentially nipple sparing with reconstruction.  After lengthy discussion she opted not to do that and wished proceed with bilateral simple mastectomies.  Risks and benefits of surgery were reviewed with the patient.  Expected postop recovery, complications and potential other treatments discussed.  We also recommended injecting mag trace at the time on the right just to ensure that if we do have invasive disease and she may require a sentinel lymph node mapping, we could do so at a later time.  She was in agreement to all the above.The surgical and non surgical options have been discussed with the patient.  Risks of surgery include bleeding,  Infection,  Flap necrosis,  Tissue loss,  Chronic pain, death, Numbness,  And the need for additional procedures.  Reconstruction options also have been discussed with the patient as well.  The patient agrees to proceed.     Description of procedure: The patient was met in the holding area and questions were answered.  She underwent bilateral pectoral blocks per anesthesia.  She was then taken back to the operating room and placed supine upon the operating room table.  After induction of general anesthesia, a timeout was performed and the right nipple was prepped with alcohol.  2 cc of MAC tracer injected in a subareolar position and  massaged.  We then prepped and draped both breast.  A second timeout was performed.  The left side was done first.  Curvilinear incisions were made above and below the nipple areolar complex.  Superior skin flap was taken the clavicle, to the midline and then to the inframammary fold.  She had very little breast tissue.  We then dissected the breast off the chest wall with the nipple in a medial to lateral fashion to lateral attachments were identified and excised.  Again she had very small breasts and limited breast tissue.  We achieved hemostasis with cautery.  Transaminase acid soak sponges were placed in the wound.  The right side was done in a similar fashion.  Curvilinear incisions were made above and below the nipple areolar complex.  Breast tissue was extremely thin on the side as well.  We took the tissue up toward the clavicle, sternum medially and then to the inframammary fold and then laterally to the lateral attachments.  The breast was dissected off the chest wall in a medial lateral fashion in a similar fashion.  Once the lateral attachments were identified the breast was then divided.  Hemostasis achieved with cautery and transaminase acid soaked sponges were placed as well.  Hemostasis was achieved on both sides.  Through separate stab incisions 3 inferior flaps 19 round drains were placed 1 on each side.  This was secured to the skin with 2-0 nylon.  We reinspected both wounds and good hemostasis.  We then used 3-0 Vicryl to approximate the skin edges.  4 Monocryl was used to close  both sides in a subcuticular fashion.  Dermabond applied to both sides.  Bulb was placed to bulb suction.  Breast binder placed.  All counts were found to be correct.  The patient was awoke extubated taken to recovery in satisfactory condition.

## 2022-05-29 NOTE — H&P (Signed)
History of Present Illness: Annette Cannon is a 67 y.o. female who is seen today as an office consultation for evaluation of Breast Cancer  Patient seen today in the [facility]. Recent mammogram showed density in the right breast. Core biopsy showed low-grade DCIS but also fibrocystic changes as well. She also had atypical ductal hyperplasia as well. She had 2 biopsies done. History of dense breast tissue. No history of significant pain but she has had cysts in the past.  Review of Systems: A complete review of systems was obtained from the patient. I have reviewed this information and discussed as appropriate with the patient. See HPI as well for other ROS.    Medical History: History reviewed. No pertinent past medical history.  Patient Active Problem List  Diagnosis  Ductal carcinoma in situ (DCIS) of right breast  Menopause present  Rash  Nasal congestion  Blood in stool   Past Surgical History:  Procedure Laterality Date  .Breast biopsy Right 04/18/2022    No Known Allergies  Current Outpatient Medications on File Prior to Visit  Medication Sig Dispense Refill  triamcinolone 0.1 % cream Apply 1 Application topically 2 (two) times daily Apply topically 2 times daily   No current facility-administered medications on file prior to visit.   Family History  Family history unknown: Yes    Social History   Tobacco Use  Smoking Status Never  Smokeless Tobacco Never    Social History   Socioeconomic History  Marital status: Married  Tobacco Use  Smoking status: Never  Smokeless tobacco: Never  Vaping Use  Vaping Use: Never used  Substance and Sexual Activity  Alcohol use: Yes  Comment: 1-2 glasses, not often  Drug use: Never   Objective:  There were no vitals filed for this visit.  There is no height or weight on file to calculate BMI.  Physical Exam HENT:  Head: Normocephalic.  Cardiovascular:  Rate and Rhythm: Normal rate.  Pulmonary:  Effort: Pulmonary  effort is normal.  Chest:  Breasts: Right: Normal.  Left: Normal.  Comments: Very small breast bilaterally and dense   Post biopsy change right  Musculoskeletal:  Cervical back: Normal range of motion.  Lymphadenopathy:  Upper Body:  Right upper body: No supraclavicular or axillary adenopathy.  Left upper body: No supraclavicular or axillary adenopathy.  Skin: General: Skin is warm.  Neurological:  General: No focal deficit present.  Psychiatric:  Mood and Affect: Mood normal.  Behavior: Behavior normal.     Labs, Imaging and Diagnostic Testing:  Diagnosis 1. Breast, right, needle core biopsy, 10:00 retroareolar, ribbon clip FOCAL ATYPICAL DUCTAL HYPERPLASIA FIBROCYSTIC CHANGES INCLUDING STROMAL FIBROSIS, CYSTIC DILATATION OF DUCTS AND USUAL DUCT HYPERPLASIA WITH FOCAL ATYPIA NEGATIVE FOR MICROCALCIFICATIONS 2. Breast, right, needle core biopsy, 10:00 1cmfn, coil clip LOW-GRADE DUCTAL CARCINOMA IN SITU, CRIBRIFORM AND PAPILLARY TYPES NEGATIVE FOR INVASIVE CARCINOMA FIBROCYSTIC CHANGES INCLUDING CYSTS DERMAL FIBROSIS AND CYSTIC DILATATION OF DUCTS DCIS MEASURES 5.5 MM IN GREATEST LINEAR EXTENT Microscopic Comment 1. -2. Immunohistochemical stains for estrogen receptor and cytokeratin 5/6 performed on both cores with adequate controls show diffuse staining for estrogen receptor with loss of cytokeratin 5/6 within the right breast biopsies 1 cm from the nipple (specimen 2). Within the retroareolar biopsy (specimen 1) these changes are only focal. No invasive tumor is seen. Tobin Chad MD Pathologist, Electronic Signature (Case signed 04/22/2022)  ADDENDUM: Pathology revealed FOCAL ATYPICAL DUCTAL HYPERPLASIA, FIBROCYSTIC CHANGES INCLUDING STROMAL FIBROSIS, CYSTIC DILATATION OF DUCTS AND USUAL DUCT HYPERPLASIA WITH FOCAL ATYPIA  of the RIGHT breast, 10:00 retroareolar, (ribbon clip). This was found to be concordant by Dr. Audie Pinto, with excision  recommended.  Pathology revealed LOW-GRADE DUCTAL CARCINOMA IN SITU, CRIBRIFORM AND PAPILLARY TYPES, FIBROCYSTIC CHANGES INCLUDING CYSTS DERMAL FIBROSIS AND CYSTIC DILATATION OF DUCTS of the RIGHT breast, 10:00 1cmfn, (coil clip). This was found to be concordant by Dr. Audie Pinto.  Pathology results were discussed with the patient by telephone. The patient reported doing well after the biopsies with tenderness and swelling at the sites. Post biopsy instructions and care were reviewed and questions were answered. The patient was encouraged to call [facility] for any additional concerns. My direct phone number was provided.  The patient was referred to [facility] on May 08, 2022.  Assessment and Plan:   Diagnoses and all orders for this visit:  Ductal carcinoma in situ (DCIS) of right breast   Discussed options with patient today. Reviewed mastectomy with or without reconstruction and breast conserving surgery. Given her small breast size and fibrocystic issues she has had throughout her life, she desires bilateral mastectomies to treat her DCIS but also to reduce her lifetime risk given her breast density and the limitations of this on screening going forward. She would like to preserve her nipple but does not reconstruction. Given her small breast size I think cosmetically this would work in her circumstance. Risks and benefits of this discussed with the patient. Risk of bleeding, infection, skin loss, nipple loss, need for revision of surgical procedure and/or potentially lymph node mapping. Plan on using mag trace during procedure.   Kennieth Francois, MD

## 2022-05-29 NOTE — Anesthesia Procedure Notes (Signed)
Anesthesia Regional Block: Pectoralis block   Pre-Anesthetic Checklist: , timeout performed,  Correct Patient, Correct Site, Correct Laterality,  Correct Procedure, Correct Position, site marked,  Risks and benefits discussed,  Surgical consent,  Pre-op evaluation,  At surgeon's request and post-op pain management  Laterality: Left  Prep: chloraprep       Needles:  Injection technique: Single-shot  Needle Type: Echogenic Stimulator Needle     Needle Length: 9cm  Needle Gauge: 21     Additional Needles:   Procedures:,,,, ultrasound used (permanent image in chart),,    Narrative:  Start time: 05/29/2022 1:10 PM End time: 05/29/2022 1:20 PM Injection made incrementally with aspirations every 5 mL.  Performed by: Personally  Anesthesiologist: Murvin Natal, MD  Additional Notes: Functioning IV was confirmed and monitors were applied.  A timeout was performed. Sterile prep, hand hygiene and sterile gloves were used. A 52m 21ga Arrow echogenic stimulator needle was used. Negative aspiration and negative test dose prior to incremental administration of local anesthetic. The patient tolerated the procedure well.  Ultrasound guidance: relevent anatomy identified, needle position confirmed, local anesthetic spread visualized around nerve(s), vascular puncture avoided.  Image printed for medical record.

## 2022-05-29 NOTE — Anesthesia Procedure Notes (Signed)
Procedure Name: LMA Insertion Date/Time: 05/29/2022 2:45 PM  Performed by: Tawni Millers, CRNAPre-anesthesia Checklist: Patient identified, Emergency Drugs available, Suction available and Patient being monitored Patient Re-evaluated:Patient Re-evaluated prior to induction Oxygen Delivery Method: Circle system utilized Preoxygenation: Pre-oxygenation with 100% oxygen Induction Type: IV induction Ventilation: Mask ventilation without difficulty LMA: LMA inserted LMA Size: 4.0 Number of attempts: 1 Airway Equipment and Method: Bite block Placement Confirmation: positive ETCO2 Tube secured with: Tape Dental Injury: Teeth and Oropharynx as per pre-operative assessment

## 2022-05-29 NOTE — Progress Notes (Signed)
Assisted Dr. Roanna Banning with left and right, pectoralis, ultrasound guided block. Side rails up, monitors on throughout procedure. See vital signs in flow sheet. Tolerated Procedure well.

## 2022-05-30 ENCOUNTER — Encounter (HOSPITAL_BASED_OUTPATIENT_CLINIC_OR_DEPARTMENT_OTHER): Payer: Self-pay | Admitting: Surgery

## 2022-05-30 DIAGNOSIS — D0511 Intraductal carcinoma in situ of right breast: Secondary | ICD-10-CM | POA: Diagnosis not present

## 2022-05-30 NOTE — Discharge Summary (Signed)
Physician Discharge Summary  Patient ID: Annette Cannon MRN: [id number] DOB/AGE: [birth year] 67 y.o.  Admit date: 05/29/2022 Discharge date: 05/30/2022  Admission Diagnoses:right breast DCIS   Discharge Diagnoses:  Active Problems:   * No active hospital problems. *   Discharged Condition: good  Hospital Course: Pt did well post mastectomy  No acute post op issues Tolerated diet and has good pain control.      Treatments: surgery: bilateral mastectomy simple   Discharge Exam: Blood pressure 128/80, pulse 78, temperature 97.8 F (36.6 C), resp. rate 16, height 5' 6"$  (1.676 m), weight 48.3 kg, SpO2 96 %. General appearance: alert and cooperative Cardio: RRR Incision/Wound:CDI flaps viable no hematoma   Disposition: Discharge disposition: 01-Home or Self Care       Discharge Instructions     Diet - low sodium heart healthy   Complete by: As directed    Increase activity slowly   Complete by: As directed          Signed: Turner Daniels MD  05/30/2022, 7:36 AM

## 2022-05-30 NOTE — Anesthesia Postprocedure Evaluation (Signed)
Anesthesia Post Note  Patient: Annette Cannon  Procedure(s) Performed: SIMPLE MASTECTOMY (Bilateral: Breast)     Patient location during evaluation: PACU Anesthesia Type: Regional and General Level of consciousness: awake Pain management: pain level controlled Vital Signs Assessment: post-procedure vital signs reviewed and stable Respiratory status: spontaneous breathing, nonlabored ventilation and respiratory function stable Cardiovascular status: blood pressure returned to baseline and stable Postop Assessment: no apparent nausea or vomiting Anesthetic complications: no   No notable events documented.  Last Vitals:  Vitals:   05/30/22 0430 05/30/22 0530  BP: 128/80   Pulse: 80 78  Resp: 16   Temp: 36.6 C   SpO2: 100% 96%    Last Pain:  Vitals:   05/30/22 0530  TempSrc:   PainSc: Asleep                 Jamesa Tedrick P Suraiya Dickerson

## 2022-05-31 LAB — SURGICAL PATHOLOGY

## 2022-06-03 ENCOUNTER — Encounter: Payer: Self-pay | Admitting: Surgery

## 2022-06-05 ENCOUNTER — Encounter: Payer: Self-pay | Admitting: *Deleted

## 2022-06-05 DIAGNOSIS — D0511 Intraductal carcinoma in situ of right breast: Secondary | ICD-10-CM

## 2022-06-18 ENCOUNTER — Encounter: Payer: Self-pay | Admitting: *Deleted

## 2022-06-19 NOTE — Therapy (Signed)
OUTPATIENT PT/OT UPPER EXTREMITY LYMPHEDEMA EVALUATION  Patient Name: Annette Cannon MRN: [id number] DOB:[birth year], 67 y.o., female Today's Date: 06/20/2022  END OF SESSION:  PT End of Session - 06/20/22 1103     Visit Number 1    Number of Visits 8    Date for PT Re-Evaluation 07/18/22    PT Start Time 1057    PT Stop Time 1221    PT Time Calculation (min) 84 min    Activity Tolerance Patient tolerated treatment well    Behavior During Therapy [facility] for tasks assessed/performed             History reviewed. No pertinent past medical history. Past Surgical History:  Procedure Laterality Date   BREAST BIOPSY Right 04/18/2022   [place] RT BREAST BX W LOC DEV 1ST LESION IMG BX SPEC US GUIDE 04/18/2022 [facility]   BREAST BIOPSY Right 04/18/2022   [place] RT BREAST BX W LOC DEV EA ADD LESION IMG BX SPEC US GUIDE 04/18/2022 [facility]   SIMPLE MASTECTOMY WITH AXILLARY SENTINEL NODE BIOPSY Bilateral 05/29/2022   Procedure: SIMPLE MASTECTOMY;  Surgeon: Erroll Luna, MD;  Location: [place];  Service: General;  Laterality: Bilateral;   TONSILECTOMY, ADENOIDECTOMY, BILATERAL MYRINGOTOMY AND TUBES     Patient Active Problem List   Diagnosis Date Noted   Genetic testing 05/20/2022   Ductal carcinoma in situ (DCIS) of right breast 05/06/2022   Menopause present 06/23/2017    REFERRING PROVIDER: Dr. Erroll Luna  REFERRING DIAG: s/p bilateral mastectomy  THERAPY DIAG:  Stiffness of left shoulder, not elsewhere classified  Stiffness of right shoulder, not elsewhere classified  Aftercare following surgery for neoplasm  ONSET DATE: 05/29/2022  Rationale for Evaluation and Treatment: Rehabilitation  SUBJECTIVE                                                                                                                                                                                           SUBJECTIVE STATEMENT: Patient reports she underwent a  bilateral mastectomy on 05/29/2022 for right sided DCIS breast cancer. No reconstruction was performed. DCIS is ER/PR positive. She sees Dr. Chryl Heck next week to discuss anti-estrogen therapy.  PERTINENT HISTORY: Right DCIS; bilateral mastectomy 05/29/2022 without axillary nodes removed. Significant scoliosis.  PAIN:  Are you having pain? No but sometimes has zinging pain due to nerve healing NPRS scale: 0/10 Pain location: right chest Pain orientation: N/A  PAIN TYPE: tingling Pain description: intermittent  Aggravating factors: nothing Relieving factors: nothing  PRECAUTIONS: Other: recent cancer  WEIGHT BEARING RESTRICTIONS: No  FALLS:  Has patient fallen in last 6 months? 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 No edema noted. Right scapula with tightness and less mobility present compared with left side.   SENSATION: Light touch: Deficits Numbness present across chest around incision sites  POSTURE: Forward head and rounded shoulders  HAND DOMINANCE: Right  UPPER EXTREMITY AROM/PROM:  A/PROM RIGHT   eval   Shoulder extension 63  Shoulder flexion 117  Shoulder abduction 140  Shoulder internal rotation 70  Shoulder external rotation 90    (Blank rows = not tested)  A/PROM LEFT   eval  Shoulder extension 57  Shoulder flexion 127  Shoulder abduction 132  Shoulder internal rotation 67   Shoulder external rotation 90    (Blank rows = not tested)   CERVICAL AROM: Left sidebending limited 25%; all others WNL  UPPER EXTREMITY STRENGTH: All 5/5   LYMPHEDEMA ASSESSMENTS:   SURGERY TYPE/DATE: Bilateral mastectomy on 05/29/2022  NUMBER OF LYMPH NODES REMOVED: 0  CHEMOTHERAPY: not needed  RADIATION:not needed  HORMONE TREATMENT: Pt to meet with Dr. Chryl Heck next week to discuss  INFECTIONS: None  PATIENT SURVEYS:   Katina Dung - 06/20/22 0001     Open a tight or new jar Mild difficulty    Do heavy household chores (wash walls, wash floors) Unable    Carry a shopping bag or briefcase Moderate difficulty    Wash your back Mild difficulty    Use a knife to cut food No difficulty    Recreational activities in which you take some force or impact through your arm, shoulder, or hand (golf, hammering, tennis) Unable    During the past week, to what extent has your arm, shoulder or hand problem interfered with your normal social activities with family, friends, neighbors, or groups? Not at all    During the past week, to what extent has your arm, shoulder or hand problem limited your work or other regular daily activities Modererately    Arm, shoulder, or hand pain. Mild    Tingling (pins and needles) in your arm, shoulder, or hand None    Difficulty Sleeping Moderate difficulty    DASH Score 38.64 %              TODAY'S TREATMENT:  DATE: 06/20/2022   PATIENT EDUCATION:  Education details: HEP including 4 post op ROM exercises and also closed chain flexion and abduction Person educated: Patient and Spouse Education method: Explanation, Demonstration, Tactile cues, Verbal cues, and Handouts Education comprehension: verbalized understanding, returned demonstration, and verbal cues required  HOME EXERCISE PROGRAM: 4 post op ROM  exercises and also closed chain flexion and abduction  ASSESSMENT:  CLINICAL IMPRESSION: Patient is a 67 y.o. woman who was seen today for physical therapy evaluation and treatment for bilateral mastectomy. Her left chest incision is well healed with no complaints. Her right chest incision has some redness and scabby areas but does not appear to be infected. Photo taken. She has reportedly been very cautious with her activity and required some encouragement to become more active and begin using her arms normally. She will benefit from PT to regain shoulder ROM and function and improve scar mobility mostly on the right side once incision is healed.  OBJECTIVE IMPAIRMENTS: decreased knowledge of condition, decreased knowledge of use of DME, decreased ROM, increased fascial restrictions, impaired flexibility, impaired UE functional use, and postural dysfunction.   ACTIVITY LIMITATIONS: carrying, lifting, and reach over head  PARTICIPATION LIMITATIONS:  none  REHAB POTENTIAL: Excellent  CLINICAL DECISION MAKING: Stable/uncomplicated  EVALUATION COMPLEXITY: Low  GOALS: Goals reviewed with patient? Yes  SHORT TERM GOALS: Target date: 4/11/20242 Equal to long term goals    LONG TERM GOALS: Target date: 07/18/2022  Patient will be independent with her home exercise program. Baseline:  Goal status: INITIAL  2.  Patient will increase bilateral shoulder flexion AROM to >/= 140 degrees for increased ease reaching overhead. Baseline:  Goal status: INITIAL  3.  Patient will increase bilateral shoulder abduction to 160 degrees for increased ease reaching. Baseline:  Goal status: INITIAL  4.  Patient will reduce Quick DASH score to </= 10 for improved overall UE function. Baseline:  Goal status: INITIAL  5.  Patient will demonstrate she is independent with her HEP and safe in a self-progression. Baseline:  Goal status: INITIAL PLAN:  PT FREQUENCY: 1-2x/week  PT DURATION: 4  weeks  PLANNED INTERVENTIONS: Therapeutic exercises, Therapeutic activity, Patient/Family education, Self Care, scar mobilization, Manual therapy, and Re-evaluation  PLAN FOR NEXT SESSION: PROM bil shoulders and AAROM exercises to pt tolerance; scar massage on left chest; assess right chest to see if she is ready for scar massage   [facility]  [address], [address]  [place] [address]  [phone]  After Breast Cancer Class It is recommended you attend the ABC class to be educated on lymphedema risk reduction. This class is free of charge and lasts for 1 hour. It is a 1-time class. You will need to download the Webex app either on your phone or computer. We will send you a link the night before or the morning of the class. You should be able to click on that link to join the class. This is not a confidential class. You don't have to turn your camera on, but other participants may be able to see your email address.  Scar massage You can begin gentle scar massage to you incision sites. Gently place one hand on the incision and move the skin (without sliding on the skin) in various directions. Do this for a few minutes and then you can gently massage either coconut oil or vitamin E cream into the scars.  Compression garment You should continue wearing your compression bra until you feel like  you no longer have swelling.  Home exercise Program Continue doing the exercises you were given until you feel like you can do them without feeling any tightness at the end.   Walking Program Studies show that 30 minutes of walking per day (fast enough to elevate your heart rate) can significantly reduce the risk of a cancer recurrence. If you can't walk due to other medical reasons, we encourage you to find another activity you could do (like a stationary bike or water exercise).  Posture After breast cancer surgery, people frequently sit with rounded shoulders posture  because it puts their incisions on slack and feels better. If you sit like this and scar tissue forms in that position, you can become very tight and have pain sitting or standing with good posture. Try to be aware of your posture and sit and stand up tall to heal properly.  Follow up PT: It is recommended you return every 3 months for the first 3 years following surgery to be assessed on the SOZO machine for an L-Dex score. This helps prevent clinically significant lymphedema in 95% of patients. These follow up screens are 10 minute appointments that you are not billed for.  Annia Friendly, [place] 06/20/22 12:55 PM

## 2022-06-20 ENCOUNTER — Other Ambulatory Visit: Payer: Self-pay

## 2022-06-20 ENCOUNTER — Encounter: Payer: Self-pay | Admitting: Physical Therapy

## 2022-06-20 ENCOUNTER — Ambulatory Visit: Payer: Medicare Other | Attending: Surgery | Admitting: Physical Therapy

## 2022-06-20 DIAGNOSIS — Z853 Personal history of malignant neoplasm of breast: Secondary | ICD-10-CM | POA: Insufficient documentation

## 2022-06-20 DIAGNOSIS — Z483 Aftercare following surgery for neoplasm: Secondary | ICD-10-CM | POA: Insufficient documentation

## 2022-06-20 DIAGNOSIS — Z9889 Other specified postprocedural states: Secondary | ICD-10-CM | POA: Diagnosis not present

## 2022-06-20 DIAGNOSIS — Z9013 Acquired absence of bilateral breasts and nipples: Secondary | ICD-10-CM | POA: Insufficient documentation

## 2022-06-20 DIAGNOSIS — M25612 Stiffness of left shoulder, not elsewhere classified: Secondary | ICD-10-CM | POA: Diagnosis present

## 2022-06-20 DIAGNOSIS — M25611 Stiffness of right shoulder, not elsewhere classified: Secondary | ICD-10-CM | POA: Insufficient documentation

## 2022-06-20 NOTE — Patient Instructions (Signed)
[facility]  [address], [address]  [place] [address]  [phone]   Scar massage You can begin gentle scar massage to you incision sites. Gently place one hand on the incision and move the skin (without sliding on the skin) in various directions. Do this for a few minutes and then you can gently massage either coconut oil or vitamin E cream into the scars.  You are ok to begin wearing a regular sports bra if you want to. You don't need to continue wearing your post op bra.  Home exercise Program Continue doing the exercises you were given until you feel like you can do them without feeling any tightness at the end.   Walking Program Studies show that 30 minutes of walking per day (fast enough to elevate your heart rate) can significantly reduce the risk of a cancer recurrence. If you can't walk due to other medical reasons, we encourage you to find another activity you could do (like a stationary bike or water exercise).  Posture After breast cancer surgery, people frequently sit with rounded shoulders posture because it puts their incisions on slack and feels better. If you sit like this and scar tissue forms in that position, you can become very tight and have pain sitting or standing with good posture. Try to be aware of your posture and sit and stand up tall to heal properly.   Closed Chain: Shoulder Abduction / Adduction - on Wall    One hand on wall, step to side and return. Stepping causes shoulder to abduct and adduct. Step _5__ times, holding 5 seconds, _2__ times per day.  http://ss.exer.us/267   Copyright  VHI. All rights reserved.  Closed Chain: Shoulder Flexion / Extension - on Wall    Hands on wall, step backward. Return. Stepping causes shoulder flexion and extension Step _5__ times, holding 5 seconds, _2__ times per day.  http://ss.exer.us/265   Copyright  VHI. All rights reserved.

## 2022-06-26 ENCOUNTER — Inpatient Hospital Stay: Payer: Medicare Other | Attending: Hematology and Oncology | Admitting: Hematology and Oncology

## 2022-06-26 ENCOUNTER — Ambulatory Visit: Payer: Medicare Other

## 2022-06-26 VITALS — BP 146/68 | HR 107 | Temp 97.9°F | Resp 16 | Ht 66.0 in | Wt 104.4 lb

## 2022-06-26 DIAGNOSIS — Z9013 Acquired absence of bilateral breasts and nipples: Secondary | ICD-10-CM | POA: Insufficient documentation

## 2022-06-26 DIAGNOSIS — D0511 Intraductal carcinoma in situ of right breast: Secondary | ICD-10-CM | POA: Diagnosis present

## 2022-06-26 DIAGNOSIS — M25612 Stiffness of left shoulder, not elsewhere classified: Secondary | ICD-10-CM

## 2022-06-26 DIAGNOSIS — M25611 Stiffness of right shoulder, not elsewhere classified: Secondary | ICD-10-CM

## 2022-06-26 DIAGNOSIS — Z483 Aftercare following surgery for neoplasm: Secondary | ICD-10-CM

## 2022-06-26 NOTE — Assessment & Plan Note (Signed)
This is a very pleasant 67 year old female patient with a newly diagnosed right breast DCIS, low-grade, ER/PR strongly positive referred to breast [place] for additional recommendations. She is now status post bilateral mastectomy.  Right breast with low-grade DCIS, ER/PR positive.  Negative margins.  Since she had bilateral mastectomy, there is no additional role for radiation or antiestrogen therapy.  With regards to going back on estrogen and progesterone supplementation, since she had noninvasive breast cancer, she may have to assume that there is a small increased risk of the residual breast tissue and cancer arising from it.  If her quality of life is miserable without the estrogen and progesterone supplementation, she may consider discussing with OB/GYN again to restart it of course assuming the risks as mentioned above.  We have also talked about nonhormonal ways of controlling the vasomotor symptoms.  I have encouraged her to call us if she does not have any relief of insomnia or the hot flashes in the next month or 2.  She expressed understanding.  She can return to clinic in 1 year or sooner as needed.

## 2022-06-26 NOTE — Progress Notes (Signed)
[facility] NOTE  Patient Care Team: Mckinley Jewel, MD as PCP - General (Internal Medicine) Erroll Luna, MD as Consulting Physician (General Surgery) Benay Pike, MD as Consulting Physician (Hematology and Oncology) Eppie Gibson, MD as Attending Physician (Radiation Oncology) Mauro Kaufmann, RN as Oncology Nurse Navigator Rockwell Germany, RN as Oncology Nurse Navigator [place], Nelia Shi, NP as Nurse Practitioner (Gynecology)  CHIEF COMPLAINTS/PURPOSE OF CONSULTATION:  Newly diagnosed breast cancer  HISTORY OF PRESENTING ILLNESS:  Annette Cannon 67 y.o. female is here because of recent diagnosis of right  Breast DCIS  I reviewed her records extensively and collaborated the history with the patient.  SUMMARY OF ONCOLOGIC HISTORY: Oncology History  Ductal carcinoma in situ (DCIS) of right breast  04/04/2022 Mammogram   Bilateral screening mammogram shows possible right breast mass warranting further evaluation.  In the left breast no findings suspicious for malignancy.  Diagnostic mammogram showed new complex mass right breast 10 o'clock position retroareolar location, persistent solid and cystic mass right breast 10 o'clock position 1 cm from the nipple   04/18/2022 Pathology Results   Right breast needle core biopsy at 10:00 showed focal atypical ductal hyperplasia, fibrocystic changes including stromal fibrosis, cystic dilatation of ducts and usual ductal hyperplasia with focal atypia, negative for microcalcifications.  Right breast needle core biopsy at 10:00 shows low-grade DCIS, negative for invasive carcinoma.  Prognostics on the DCIS showed ER 100% positive strong staining PR 100% positive strong staining   05/06/2022 Initial Diagnosis   Ductal carcinoma in situ (DCIS) of right breast    Genetic Testing   [facility] Multi-Cancer Panel+RNA was Negative. Of note, a variant of uncertain significance was detected in the ATM gene ([id number]). Report date  is 05/15/2022.  The Multi-Cancer + RNA Panel offered by [facility] includes sequencing and/or deletion/duplication analysis of the following 70 genes:  AIP*, ALK, APC*, ATM*, AXIN2*, BAP1*, BARD1*, BLM*, BMPR1A*, BRCA1*, BRCA2*, BRIP1*, CDC73*, CDH1*, CDK4, CDKN1B*, CDKN2A, CHEK2*, CTNNA1*, DICER1*, EPCAM (del/dup only), EGFR, FH*, FLCN*, GREM1 (promoter dup only), HOXB13, KIT, LZTR1, MAX*, MBD4, MEN1*, MET, MITF, MLH1*, MSH2*, MSH3*, MSH6*, MUTYH*, NF1*, NF2*, NTHL1*, PALB2*, PDGFRA, PMS2*, POLD1*, POLE*, POT1*, PRKAR1A*, PTCH1*, PTEN*, RAD51C*, RAD51D*, RB1*, RET, SDHA* (sequencing only), SDHAF2*, SDHB*, SDHC*, SDHD*, SMAD4*, SMARCA4*, SMARCB1*, SMARCE1*, STK11*, SUFU*, TMEM127*, TP53*, TSC1*, TSC2*, VHL*. RNA analysis is performed for * genes.    Ms. Arzetta is here for a follow up.  Since her last visit here she had bilateral mastectomy which was negative for carcinoma.   Right breast mastectomy showed DCIS low-grade discontinuously involving a fibrotic area measuring 1.2 cm, no evidence of invasive cancer.  Resection margins negative for DCIS.  Prognostics showed ER 100% positive strong staining PR 100% positive strong staining.  She is healing well at this time.  She had several good questions about role of antiestrogens, estrogen replacement and management of vasomotor symptoms.  According to husband, she has not been sleeping well, has been having some cognition issues and hot flashes since she stopped the estrogen and progesterone.  Rest of the pertinent 10 point ROS reviewed and negative  MEDICAL HISTORY:  No past medical history on file.  SURGICAL HISTORY: Past Surgical History:  Procedure Laterality Date   BREAST BIOPSY Right 04/18/2022   [place] RT BREAST BX W LOC DEV 1ST LESION IMG BX SPEC US GUIDE 04/18/2022 [facility]   BREAST BIOPSY Right 04/18/2022   [place] RT BREAST BX W LOC DEV EA ADD LESION IMG BX SPEC US  GUIDE 04/18/2022 [facility]   SIMPLE MASTECTOMY WITH AXILLARY  SENTINEL NODE BIOPSY Bilateral 05/29/2022   Procedure: SIMPLE MASTECTOMY;  Surgeon: Erroll Luna, MD;  Location: [place];  Service: General;  Laterality: Bilateral;   TONSILECTOMY, ADENOIDECTOMY, BILATERAL MYRINGOTOMY AND TUBES      SOCIAL HISTORY: Social History   Socioeconomic History   Marital status: Married    Spouse name: Not on file   Number of children: Not on file   Years of education: Not on file   Highest education level: Not on file  Occupational History   Not on file  Tobacco Use   Smoking status: Never   Smokeless tobacco: Not on file  Substance and Sexual Activity   Alcohol use: Yes    Comment: occase   Drug use: Never   Sexual activity: Not on file  Other Topics Concern   Not on file  Social History Narrative   Not on file   Social Determinants of Health   Financial Resource Strain: Low Risk  (05/08/2022)   Overall Financial Resource Strain (CARDIA)    Difficulty of Paying Living Expenses: Not hard at all  Food Insecurity: No Food Insecurity (05/08/2022)   Hunger Vital Sign    Worried About Running Out of Food in the Last Year: Never true    Ran Out of Food in the Last Year: Never true  Transportation Needs: No Transportation Needs (05/08/2022)   PRAPARE - Hydrologist (Medical): No    Lack of Transportation (Non-Medical): No  Physical Activity: Not on file  Stress: Not on file  Social Connections: Not on file  Intimate Partner Violence: Not on file    FAMILY HISTORY: Family History  Problem Relation Age of Onset   Breast cancer Maternal Grandmother     ALLERGIES:  has No Known Allergies.  MEDICATIONS:  No current outpatient medications on file.   No current facility-administered medications for this visit.    REVIEW OF SYSTEMS:   Constitutional: Denies fevers, chills or abnormal night sweats Eyes: Denies blurriness of vision, double vision or watery eyes Ears, nose, mouth, throat, and face:  Denies mucositis or sore throat Respiratory: Denies cough, dyspnea or wheezes Cardiovascular: Denies palpitation, chest discomfort or lower extremity swelling Gastrointestinal:  Denies nausea, heartburn or change in bowel habits Skin: Denies abnormal skin rashes Lymphatics: Denies new lymphadenopathy or easy bruising Neurological:Denies numbness, tingling or new weaknesses Behavioral/Psych: Mood is stable, no new changes  Breast: Denies any palpable lumps or discharge All other systems were reviewed with the patient and are negative.  PHYSICAL EXAMINATION: ECOG PERFORMANCE STATUS: 0 - Asymptomatic  Vitals:   06/26/22 1328  BP: (!) 146/68  Pulse: (!) 107  Resp: 16  Temp: 97.9 F (36.6 C)  SpO2: 99%    Filed Weights   06/26/22 1328  Weight: 104 lb 6.4 oz (47.4 kg)     Physical exam deferred today in lieu of counseling  LABORATORY DATA:  I have reviewed the data as listed Lab Results  Component Value Date   WBC 6.4 05/08/2022   HGB 14.7 05/08/2022   HCT 43.2 05/08/2022   MCV 88.0 05/08/2022   PLT 192 05/08/2022   Lab Results  Component Value Date   NA 139 05/08/2022   K 3.8 05/08/2022   CL 103 05/08/2022   CO2 31 05/08/2022    RADIOGRAPHIC STUDIES: I have personally reviewed the radiological reports and agreed with the findings in the report.  ASSESSMENT  AND PLAN:  Ductal carcinoma in situ (DCIS) of right breast This is a very pleasant 67 year old female patient with a newly diagnosed right breast DCIS, low-grade, ER/PR strongly positive referred to breast [place] for additional recommendations. She is now status post bilateral mastectomy.  Right breast with low-grade DCIS, ER/PR positive.  Negative margins.  Since she had bilateral mastectomy, there is no additional role for radiation or antiestrogen therapy.  With regards to going back on estrogen and progesterone supplementation, since she had noninvasive breast cancer, she may have to assume that there is a small  increased risk of the residual breast tissue and cancer arising from it.  If her quality of life is miserable without the estrogen and progesterone supplementation, she may consider discussing with OB/GYN again to restart it of course assuming the risks as mentioned above.  We have also talked about nonhormonal ways of controlling the vasomotor symptoms.  I have encouraged her to call us if she does not have any relief of insomnia or the hot flashes in the next month or 2.  She expressed understanding.  She can return to clinic in 1 year or sooner as needed.   Total time spent: 30 minutes including history, physical exam, review of records, counseling and coordination of care All questions were answered. The patient knows to call the clinic with any problems, questions or concerns.    Benay Pike, MD 06/26/22

## 2022-06-26 NOTE — Therapy (Signed)
OUTPATIENT PT/OT UPPER EXTREMITY LYMPHEDEMA TREATMENT  Patient Name: Annette Cannon MRN: [id number] DOB:1955-11-22, 67 y.o., female Today's Date: 06/26/2022  END OF SESSION:  PT End of Session - 06/26/22 1059     Visit Number 2    Number of Visits 8    Date for PT Re-Evaluation 07/18/22    PT Start Time 1101    PT Stop Time 1201    PT Time Calculation (min) 60 min    Activity Tolerance Patient tolerated treatment well    Behavior During Therapy [facility] for tasks assessed/performed             History reviewed. No pertinent past medical history. Past Surgical History:  Procedure Laterality Date   BREAST BIOPSY Right 04/18/2022   [place] RT BREAST BX W LOC DEV 1ST LESION IMG BX SPEC US GUIDE 04/18/2022 [facility]   BREAST BIOPSY Right 04/18/2022   [place] RT BREAST BX W LOC DEV EA ADD LESION IMG BX SPEC US GUIDE 04/18/2022 [facility]   SIMPLE MASTECTOMY WITH AXILLARY SENTINEL NODE BIOPSY Bilateral 05/29/2022   Procedure: SIMPLE MASTECTOMY;  Surgeon: Erroll Luna, MD;  Location: [facility];  Service: General;  Laterality: Bilateral;   TONSILECTOMY, ADENOIDECTOMY, BILATERAL MYRINGOTOMY AND TUBES     Patient Active Problem List   Diagnosis Date Noted   Genetic testing 05/20/2022   Ductal carcinoma in situ (DCIS) of right breast 05/06/2022   Menopause present 06/23/2017    REFERRING PROVIDER: Dr. Erroll Luna  REFERRING DIAG: s/p bilateral mastectomy  THERAPY DIAG:  Stiffness of left shoulder, not elsewhere classified  Stiffness of right shoulder, not elsewhere classified  Aftercare following surgery for neoplasm  ONSET DATE: 05/29/2022  Rationale for Evaluation and Treatment: Rehabilitation  SUBJECTIVE                                                                                                                                                                                           SUBJECTIVE STATEMENT: I've been doing the exercises she  gave me last time and I think they have been going well. I do want to review them though to make sure I'm still doing them right.   PERTINENT HISTORY: Right DCIS; bilateral mastectomy 05/29/2022 without axillary nodes removed. Significant scoliosis.  PAIN:  Are you having pain? No but sometimes has zinging pain due to nerve healing NPRS scale: 0/10 Pain location: right chest Pain orientation: N/A  PAIN TYPE: tingling Pain description: intermittent  Aggravating factors: nothing Relieving factors: nothing  PRECAUTIONS: Other: recent cancer  WEIGHT BEARING RESTRICTIONS: No  FALLS:  Has patient fallen in last 6 months? 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 No edema noted. Right scapula with tightness and less mobility present compared with left side.   SENSATION: Light touch: Deficits Numbness present across chest around incision sites  POSTURE: Forward head and rounded shoulders  HAND DOMINANCE: Right  UPPER EXTREMITY AROM/PROM:  A/PROM RIGHT   eval   Shoulder extension 63  Shoulder flexion 117  Shoulder abduction 140  Shoulder internal rotation 70  Shoulder external rotation 90    (Blank rows = not tested)  A/PROM LEFT   eval  Shoulder extension 57  Shoulder flexion 127  Shoulder abduction 132  Shoulder internal rotation 67  Shoulder external rotation 90    (Blank rows = not  tested)   CERVICAL AROM: Left sidebending limited 25%; all others WNL  UPPER EXTREMITY STRENGTH: All 5/5   LYMPHEDEMA ASSESSMENTS:   SURGERY TYPE/DATE: Bilateral mastectomy on 05/29/2022  NUMBER OF LYMPH NODES REMOVED: 0  CHEMOTHERAPY: not needed  RADIATION:not needed  HORMONE TREATMENT: Pt to meet with Dr. Chryl Heck next week to discuss  INFECTIONS: None  PATIENT SURVEYS:      TODAY'S TREATMENT:                                                                                                                                         DATE:  06/26/2022: Therapeutic Exercises Pulleys into flexion x 1 min but minimal stretch felt so stopped, then abduction x 2 mins with VC's to be aware of scapular compensation but pt seemed to do fairly well with this Roll yellow ball up wall flexion x 5 and then bil abd x5 each as well returning therapist demo Modified downward dog on wall x 5, with 5 sec holds Supine reviewed fingers clasped behind head for open/close elbows 5x, then fingers clasped at waist for OH flexion 5x. Pt able to return good demo and answered her questions that she had throughout about correct technique and positioning Manual therapy P/ROM to bil shoulders into flexion, abd, D2, and er/IR all to pts available end motions MFR to bil axillae and chest walls during P/ROM with blocking at Rt chest wall to prevent pulling at superior healing incision here  PATIENT EDUCATION:  Education details: HEP including 4 post op ROM exercises and also closed chain flexion and abduction Person educated: Patient and Spouse Education  method: Explanation, Demonstration, Tactile cues, Verbal cues, and Handouts Education comprehension: verbalized understanding, returned demonstration, and verbal cues required  HOME EXERCISE PROGRAM: 4 post op ROM exercises and also closed chain flexion and abduction  ASSESSMENT:  CLINICAL IMPRESSION: First full session of AA/A/P/ROM exercises and began  manual therapy working to decrease bil upper quadrant tightness while working to improve her end bil shoulder P/ROM. Also was mindful of not pulling at healing Rt chest wall incisions.   OBJECTIVE IMPAIRMENTS: decreased knowledge of condition, decreased knowledge of use of DME, decreased ROM, increased fascial restrictions, impaired flexibility, impaired UE functional use, and postural dysfunction.   ACTIVITY LIMITATIONS: carrying, lifting, and reach over head  PARTICIPATION LIMITATIONS:  none  REHAB POTENTIAL: Excellent  CLINICAL DECISION MAKING: Stable/uncomplicated  EVALUATION COMPLEXITY: Low  GOALS: Goals reviewed with patient? Yes  SHORT TERM GOALS: Target date: 4/11/20242 Equal to long term goals    LONG TERM GOALS: Target date: 07/18/2022  Patient will be independent with her home exercise program. Baseline:  Goal status: INITIAL  2.  Patient will increase bilateral shoulder flexion AROM to >/= 140 degrees for increased ease reaching overhead. Baseline:  Goal status: INITIAL  3.  Patient will increase bilateral shoulder abduction to 160 degrees for increased ease reaching. Baseline:  Goal status: INITIAL  4.  Patient will reduce Quick DASH score to </= 10 for improved overall UE function. Baseline:  Goal status: INITIAL  5.  Patient will demonstrate she is independent with her HEP and safe in a self-progression. Baseline:  Goal status: INITIAL PLAN:  PT FREQUENCY: 1-2x/week  PT DURATION: 4 weeks  PLANNED INTERVENTIONS: Therapeutic exercises, Therapeutic activity, Patient/Family education, Self Care, scar mobilization, Manual therapy, and Re-evaluation  PLAN FOR NEXT SESSION: Cont PROM bil shoulders and AAROM exercises to pt tolerance; scar massage on left chest; assess right chest to see if she is ready for scar massage   [facility]  [address], [address] [address]  [phone]  Collie Siad, PTA 06/26/22  12:10 PM

## 2022-06-27 ENCOUNTER — Telehealth: Payer: Self-pay | Admitting: Adult Health

## 2022-06-27 NOTE — Telephone Encounter (Signed)
Per 3/21 IB reached out to patient left voicemail.

## 2022-07-03 ENCOUNTER — Ambulatory Visit: Payer: Medicare Other

## 2022-07-03 DIAGNOSIS — Z483 Aftercare following surgery for neoplasm: Secondary | ICD-10-CM

## 2022-07-03 DIAGNOSIS — M25612 Stiffness of left shoulder, not elsewhere classified: Secondary | ICD-10-CM

## 2022-07-03 DIAGNOSIS — M25611 Stiffness of right shoulder, not elsewhere classified: Secondary | ICD-10-CM

## 2022-07-03 NOTE — Therapy (Addendum)
OUTPATIENT PT UPPER EXTREMITY LYMPHEDEMA TREATMENT  Patient Name: Annette Cannon MRN: [id number] DOB:[birth year], 67 y.o., female Today's Date: 07/03/2022  END OF SESSION:  PT End of Session - 07/03/22 1012     Visit Number 3    Number of Visits 8    Date for PT Re-Evaluation 07/18/22    PT Start Time 1008    PT Stop Time 1104    PT Time Calculation (min) 56 min    Activity Tolerance Patient tolerated treatment well    Behavior During Therapy [facility] for tasks assessed/performed             History reviewed. No pertinent past medical history. Past Surgical History:  Procedure Laterality Date   BREAST BIOPSY Right 04/18/2022   [place] RT BREAST BX W LOC DEV 1ST LESION IMG BX SPEC US GUIDE 04/18/2022 [facility]   BREAST BIOPSY Right 04/18/2022   [place] RT BREAST BX W LOC DEV EA ADD LESION IMG BX SPEC US GUIDE 04/18/2022 [facility]   SIMPLE MASTECTOMY WITH AXILLARY SENTINEL NODE BIOPSY Bilateral 05/29/2022   Procedure: SIMPLE MASTECTOMY;  Surgeon: Erroll Luna, MD;  Location: [facility];  Service: General;  Laterality: Bilateral;   TONSILECTOMY, ADENOIDECTOMY, BILATERAL MYRINGOTOMY AND TUBES     Patient Active Problem List   Diagnosis Date Noted   Genetic testing 05/20/2022   Ductal carcinoma in situ (DCIS) of right breast 05/06/2022   Menopause present 06/23/2017    REFERRING PROVIDER: Dr. Erroll Luna  REFERRING DIAG: s/p bilateral mastectomy  THERAPY DIAG:  Stiffness of left shoulder, not elsewhere classified  Stiffness of right shoulder, not elsewhere classified  Aftercare following surgery for neoplasm  ONSET DATE: 05/29/2022  Rationale for Evaluation and Treatment: Rehabilitation  SUBJECTIVE                                                                                                                                                                                           SUBJECTIVE STATEMENT: I have eased off on my stretches since  I was here last because my Rt chest is being so slow to heal and I didn't want to do anything to mess that up. Scabs are developing though so that's good.   PERTINENT HISTORY: Right DCIS; bilateral mastectomy 05/29/2022 without axillary nodes removed. Significant scoliosis.  PAIN:  Are you having pain? No but sometimes has zinging pain due to nerve healing, getting better but still there NPRS scale: 0/10 Pain location: right chest Pain orientation: N/A  PAIN TYPE: tingling Pain description: intermittent  Aggravating factors: nothing Relieving factors: nothing  PRECAUTIONS: Other: recent cancer  WEIGHT BEARING RESTRICTIONS:  No  FALLS:  Has patient fallen in last 6 months? 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 No edema noted. Right scapula with tightness and less mobility present compared with left side.   SENSATION: Light touch: Deficits Numbness present across chest around incision sites  POSTURE: Forward head and rounded shoulders  HAND DOMINANCE: Right  UPPER EXTREMITY AROM/PROM:  A/PROM RIGHT   eval   Shoulder extension 63  Shoulder flexion 117  Shoulder abduction 140  Shoulder internal rotation 70  Shoulder external rotation 90    (Blank rows = not tested)  A/PROM LEFT   eval  Shoulder extension 57  Shoulder flexion 127  Shoulder abduction 132  Shoulder internal rotation 67  Shoulder  external rotation 90    (Blank rows = not tested)   CERVICAL AROM: Left sidebending limited 25%; all others WNL  UPPER EXTREMITY STRENGTH: All 5/5   LYMPHEDEMA ASSESSMENTS:   SURGERY TYPE/DATE: Bilateral mastectomy on 05/29/2022  NUMBER OF LYMPH NODES REMOVED: 0  CHEMOTHERAPY: not needed  RADIATION:not needed  HORMONE TREATMENT: Pt to meet with Dr. Chryl Heck next week to discuss  INFECTIONS: None  PATIENT SURVEYS:      TODAY'S TREATMENT:                                                                                                                                         DATE:  07/03/22: Therapeutic Exercises Pulleys into flexion x 1 min then abduction x 2 mins  Roll yellow ball up wall flexion x 10 and then bil abd x5 each as well returning therapist demo Modified downward dog on wall x 5, with 5 sec holds Manual therapy P/ROM to bil shoulders into flexion, abd, D2, and er/IR all to pts available end motions MFR to bil axillae and chest walls during P/ROM with blocking at Rt chest wall to prevent pulling at superior healing incision here STM to bil upper traps with cocoa butter at end of session where pt reports tightness lately  06/26/2022: Therapeutic Exercises Pulleys into flexion x 1 min but minimal stretch felt so stopped, then abduction x 2 mins with VC's to be aware of scapular compensation but pt seemed to do fairly well with this Roll yellow ball up wall flexion x 5 and then bil abd x5 each as well returning therapist  demo Modified downward dog on wall x 5, with 5 sec holds Supine reviewed fingers clasped behind head for open/close elbows 5x, then fingers clasped at waist for OH flexion 5x. Pt able to return good demo and answered her questions that she had throughout about correct technique and positioning Manual therapy P/ROM to bil shoulders into flexion, abd, D2, and er/IR all to pts available end motions MFR to bil axillae and chest walls during P/ROM with  blocking at Rt chest wall to prevent pulling at superior healing incision here  PATIENT EDUCATION:  Education details: HEP including 4 post op ROM exercises and also closed chain flexion and abduction Person educated: Patient and Spouse Education method: Consulting civil engineer, Demonstration, Tactile cues, Verbal cues, and Handouts Education comprehension: verbalized understanding, returned demonstration, and verbal cues required  HOME EXERCISE PROGRAM: 4 post op ROM exercises and also closed chain flexion and abduction  ASSESSMENT:  CLINICAL IMPRESSION: Continue with AA/ROM stretches and manual therapy working to decrease bil upper quadrant tightness. Pt Rt chest now has scabs present at all 3 small spots so healing well. Continued to be mindful of healing areas with stretching being not to allow skin pull here.   OBJECTIVE IMPAIRMENTS: decreased knowledge of condition, decreased knowledge of use of DME, decreased ROM, increased fascial restrictions, impaired flexibility, impaired UE functional use, and postural dysfunction.   ACTIVITY LIMITATIONS: carrying, lifting, and reach over head  PARTICIPATION LIMITATIONS:  none  REHAB POTENTIAL: Excellent  CLINICAL DECISION MAKING: Stable/uncomplicated  EVALUATION COMPLEXITY: Low  GOALS: Goals reviewed with patient? Yes  SHORT TERM GOALS: Target date: 4/11/20242 Equal to long term goals    LONG TERM GOALS: Target date: 07/18/2022  Patient will be independent with her home exercise program. Baseline:  Goal status: INITIAL  2.  Patient will increase bilateral shoulder flexion AROM to >/= 140 degrees for increased ease reaching overhead. Baseline:  Goal status: INITIAL  3.  Patient will increase bilateral shoulder abduction to 160 degrees for increased ease reaching. Baseline:  Goal status: INITIAL  4.  Patient will reduce Quick DASH score to </= 10 for improved overall UE function. Baseline:  Goal status: INITIAL  5.  Patient will  demonstrate she is independent with her HEP and safe in a self-progression. Baseline:  Goal status: INITIAL PLAN:  PT FREQUENCY: 1-2x/week  PT DURATION: 4 weeks  PLANNED INTERVENTIONS: Therapeutic exercises, Therapeutic activity, Patient/Family education, Self Care, scar mobilization, Manual therapy, and Re-evaluation  PLAN FOR NEXT SESSION: If Rt chest well healed add supine scapular series to HEP. Cont PROM bil shoulders and AAROM exercises to pt tolerance; scar massage on left chest; assess right chest to see if she is ready for scar massage   [facility]  [address]  [place] [address]  [phone]  Collie Siad, PTA 07/03/22 11:07 AM

## 2022-07-18 ENCOUNTER — Ambulatory Visit: Payer: Medicare Other | Attending: Surgery

## 2022-07-18 DIAGNOSIS — Z483 Aftercare following surgery for neoplasm: Secondary | ICD-10-CM | POA: Insufficient documentation

## 2022-07-18 DIAGNOSIS — M25611 Stiffness of right shoulder, not elsewhere classified: Secondary | ICD-10-CM | POA: Diagnosis present

## 2022-07-18 DIAGNOSIS — M25612 Stiffness of left shoulder, not elsewhere classified: Secondary | ICD-10-CM | POA: Diagnosis present

## 2022-07-18 NOTE — Patient Instructions (Addendum)
Over Head Pull: Narrow and Wide Grip   Can[facility] Rehab [phone]   On back, knees bent, feet flat, band across thighs, elbows straight but relaxed. Pull hands apart (start). Keeping elbows straight, bring arms up and over head, hands toward floor. Keep pull steady on band. Hold momentarily. Return slowly, keeping pull steady, back to start. Then do same with a wider grip on the band (past shoulder width) Repeat _10__ times. Band color __red___   Side Pull: Double Arm   On back, knees bent, feet flat. Arms perpendicular to body, shoulder level, elbows straight but relaxed. Pull arms out to sides, elbows straight. Resistance band comes across collarbones, hands toward floor. Hold momentarily. Slowly return to starting position. Repeat _10__ times. Band color _red____   Sword   On back, knees bent, feet flat, left hand on left hip, right hand above left. Pull right arm DIAGONALLY (hip to shoulder) across chest. Bring right arm along head toward floor. Hold momentarily. Slowly return to starting position. Repeat _10__ times. Do with left arm. Band color _red_____   Shoulder Rotation: Double Arm   On back, knees bent, feet flat, elbows tucked at sides, bent 90, hands palms up. Pull hands apart and down toward floor, keeping elbows near sides. Hold momentarily. Slowly return to starting position. Repeat _10__ times. Band color __red____   Start doing these a few times a week or every 1-2 days. Red is easier than green.  Then can progress to doing these in standing with your back against the wall/core engaged and also press shoulders (as much as possible) and head against wall to be as erect as possible.

## 2022-07-18 NOTE — Therapy (Signed)
OUTPATIENT PT UPPER EXTREMITY LYMPHEDEMA TREATMENT  Patient Name: Annette Cannon MRN: [id number] DOB:September 25, 1955, 67 y.o., female Today's Date: 07/18/2022  END OF SESSION:  PT End of Session - 07/18/22 1111     Visit Number 4    Number of Visits 8    Date for PT Re-Evaluation 07/18/22    PT Start Time 1108    PT Stop Time 1208    PT Time Calculation (min) 60 min    Activity Tolerance Patient tolerated treatment well    Behavior During Therapy [facility] for tasks assessed/performed             History reviewed. No pertinent past medical history. Past Surgical History:  Procedure Laterality Date   BREAST BIOPSY Right 04/18/2022   [place] RT BREAST BX W LOC DEV 1ST LESION IMG BX SPEC US GUIDE 04/18/2022 [facility]   BREAST BIOPSY Right 04/18/2022   [place] RT BREAST BX W LOC DEV EA ADD LESION IMG BX SPEC US GUIDE 04/18/2022 [facility]   SIMPLE MASTECTOMY WITH AXILLARY SENTINEL NODE BIOPSY Bilateral 05/29/2022   Procedure: SIMPLE MASTECTOMY;  Surgeon: Harriette Bouillon, MD;  Location: [facility];  Service: General;  Laterality: Bilateral;   TONSILECTOMY, ADENOIDECTOMY, BILATERAL MYRINGOTOMY AND TUBES     Patient Active Problem List   Diagnosis Date Noted   Genetic testing 05/20/2022   Ductal carcinoma in situ (DCIS) of right breast 05/06/2022   Menopause present 06/23/2017    REFERRING PROVIDER: Dr. Harriette Bouillon  REFERRING DIAG: s/p bilateral mastectomy  THERAPY DIAG:  Stiffness of left shoulder, not elsewhere classified  Stiffness of right shoulder, not elsewhere classified  Aftercare following surgery for neoplasm  ONSET DATE: 05/29/2022  Rationale for Evaluation and Treatment: Rehabilitation  SUBJECTIVE                                                                                                                                                                                           SUBJECTIVE STATEMENT: I've been doing well. I get the sharp  pains at my chest still occasionally but much less than I was before. The HEP stretches are going well also. The three areas on my Rt chest wall have healed well since I was here last.   PERTINENT HISTORY: Right DCIS; bilateral mastectomy 05/29/2022 without axillary nodes removed. Significant scoliosis.  PAIN:  Are you having pain? No   PRECAUTIONS: Other: recent cancer  WEIGHT BEARING RESTRICTIONS: No  FALLS:  Has patient fallen in last 6 months? 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 No edema noted. Right scapula with tightness and less mobility present compared with left side.   SENSATION: Light touch: Deficits Numbness present across chest around incision sites  POSTURE: Forward head and rounded shoulders  HAND DOMINANCE: Right  UPPER EXTREMITY AROM/PROM:  A/PROM RIGHT   eval   Shoulder extension 63  Shoulder flexion 117  Shoulder abduction 140  Shoulder internal rotation 70  Shoulder external rotation 90    (Blank rows = not tested)  A/PROM LEFT   eval  Shoulder extension 57  Shoulder flexion 127  Shoulder abduction 132  Shoulder internal rotation 67  Shoulder external rotation 90    (Blank rows = not tested)   CERVICAL AROM: Left sidebending limited 25%; all others WNL  UPPER EXTREMITY STRENGTH: All 5/5   LYMPHEDEMA ASSESSMENTS:   SURGERY TYPE/DATE: Bilateral mastectomy on  05/29/2022  NUMBER OF LYMPH NODES REMOVED: 0  CHEMOTHERAPY: not needed  RADIATION:not needed  HORMONE TREATMENT: Pt to meet with Dr. Al Pimple next week to discuss  INFECTIONS: None  PATIENT SURVEYS:      TODAY'S TREATMENT:                                                                                                                                         DATE:  07/18/22: Manual therapy P/ROM to bil shoulders into flexion, abd, D2, and er/IR all to pts available end motions MFR to bil axillae and chest walls during P/ROM with blocking at Rt chest wall to prevent pulling at superior healing incision here Applied cocoa utter to bil chest wall and upper arms after MT due to increased skin dryness. Encouraged pt to cont with same at home now that areas of opening have scabbed over as moisturized skin will allow for better healing than dry.  Therapeutic Exercises Supine scapular series with red theraband x 10 each returning therapist demo. Pt able to return correct demo after cuing for technique. Also answered pts questions regarding HEP and how she can stop the stretches that she no longer feels a stretch with. Encouraged her to instead work on end ROM stretches in doorway and pectoralis doorway stretch. She returned good demo of this.   07/03/22: Therapeutic Exercises Pulleys into flexion x 1 min then abduction x 2 mins  Roll yellow ball up wall flexion x 10 and then bil abd x5 each as well returning therapist demo Modified downward dog on  wall x 5, with 5 sec holds Manual therapy P/ROM to bil shoulders into flexion, abd, D2, and er/IR all to pts available end motions MFR to bil axillae and chest walls during P/ROM with blocking at Rt chest wall to prevent pulling at superior healing incision here STM to bil upper traps with cocoa butter at end of session where pt reports tightness lately  06/26/2022: Therapeutic Exercises Pulleys into flexion x 1 min but minimal stretch felt so  stopped, then abduction x 2 mins with VC's to be aware of scapular compensation but pt seemed to do fairly well with this Roll yellow ball up wall flexion x 5 and then bil abd x5 each as well returning therapist demo Modified downward dog on wall x 5, with 5 sec holds Supine reviewed fingers clasped behind head for open/close elbows 5x, then fingers clasped at waist for OH flexion 5x. Pt able to return good demo and answered her questions that she had throughout about correct technique and positioning Manual therapy P/ROM to bil shoulders into flexion, abd, D2, and er/IR all to pts available end motions MFR to bil axillae and chest walls during P/ROM with blocking at Rt chest wall to prevent pulling at superior healing incision here  PATIENT EDUCATION:  Education details: 07/18/22: Supine scapular series Person educated: Patient and Spouse Education method: Explanation, Demonstration, Tactile cues, Verbal cues, and Handouts Education comprehension: verbalized understanding, returned demonstration, and verbal cues required  HOME EXERCISE PROGRAM: 4 post op ROM exercises and also closed chain flexion and abduction; supine scapular series  ASSESSMENT:  CLINICAL IMPRESSION: Continue with manual therapy working to decrease bil upper quadrant tightness. Pt Rt chest now has scabs or scabs have come off at all 3 small spots so continuing to heal well. Continued to be mindful of healing areas with stretching though by not allowing skin to pull here. Progressed HEP to include supine scapular series with red theraband but also issued green as red was fairly easy. Also adjusted her HEP in regards to stretching, see above. Pt scheduled 1 more visit in 2 weeks for potential D/C .   OBJECTIVE IMPAIRMENTS: decreased knowledge of condition, decreased knowledge of use of DME, decreased ROM, increased fascial restrictions, impaired flexibility, impaired UE functional use, and postural dysfunction.   ACTIVITY  LIMITATIONS: carrying, lifting, and reach over head  PARTICIPATION LIMITATIONS:  none  REHAB POTENTIAL: Excellent  CLINICAL DECISION MAKING: Stable/uncomplicated  EVALUATION COMPLEXITY: Low  GOALS: Goals reviewed with patient? Yes  SHORT TERM GOALS: Target date: 4/11/20242 Equal to long term goals    LONG TERM GOALS: Target date: 07/18/2022  Patient will be independent with her home exercise program. Baseline:  Goal status: INITIAL  2.  Patient will increase bilateral shoulder flexion AROM to >/= 140 degrees for increased ease reaching overhead. Baseline:  Goal status: INITIAL  3.  Patient will increase bilateral shoulder abduction to 160 degrees for increased ease reaching. Baseline:  Goal status: INITIAL  4.  Patient will reduce Quick DASH score to </= 10 for improved overall UE function. Baseline:  Goal status: INITIAL  5.  Patient will demonstrate she is independent with her HEP and safe in a self-progression. Baseline:  Goal status: INITIAL PLAN:  PT FREQUENCY: 1-2x/week  PT DURATION: 4 weeks  PLANNED INTERVENTIONS: Therapeutic exercises, Therapeutic activity, Patient/Family education, Self Care, scar mobilization, Manual therapy, and Re-evaluation  PLAN FOR NEXT SESSION: Review HEP prn. Instruct pt in MFR to Rt chest wall if healed well enough. Add bil  UE 3 way raises and instruct in "low and sow" progression of resistance. Possible D/C next session.   [facility]  [address], [address]  [place] [address]  [phone]  Berna Spare, PTA 07/18/22 1:22 PM  Over Head Pull: Narrow and Wide Grip   Cancer Rehab [phone]   On back, knees bent, feet flat, band across thighs, elbows straight but relaxed. Pull hands apart (start). Keeping elbows straight, bring arms up and over head, hands toward floor. Keep pull steady on band. Hold momentarily. Return slowly, keeping pull steady, back to start. Then do same with a wider grip on  the band (past shoulder width) Repeat _10__ times. Band color __red___   Side Pull: Double Arm   On back, knees bent, feet flat. Arms perpendicular to body, shoulder level, elbows straight but relaxed. Pull arms out to sides, elbows straight. Resistance band comes across collarbones, hands toward floor. Hold momentarily. Slowly return to starting position. Repeat _10__ times. Band color _red____   Sword   On back, knees bent, feet flat, left hand on left hip, right hand above left. Pull right arm DIAGONALLY (hip to shoulder) across chest. Bring right arm along head toward floor. Hold momentarily. Slowly return to starting position. Repeat _10__ times. Do with left arm. Band color _red_____   Shoulder Rotation: Double Arm   On back, knees bent, feet flat, elbows tucked at sides, bent 90, hands palms up. Pull hands apart and down toward floor, keeping elbows near sides. Hold momentarily. Slowly return to starting position. Repeat _10__ times. Band color __red____   Start doing these a few times a week or every 1-2 days. Red is easier than green.  Then can progress to doing these in standing with your back against the wall/core engaged and also press shoulders (as much as possible) and head against wall to be as erect as possible.

## 2022-07-31 ENCOUNTER — Ambulatory Visit: Payer: Medicare Other

## 2022-07-31 DIAGNOSIS — M25612 Stiffness of left shoulder, not elsewhere classified: Secondary | ICD-10-CM | POA: Diagnosis not present

## 2022-07-31 DIAGNOSIS — M25611 Stiffness of right shoulder, not elsewhere classified: Secondary | ICD-10-CM

## 2022-07-31 DIAGNOSIS — Z483 Aftercare following surgery for neoplasm: Secondary | ICD-10-CM

## 2022-07-31 NOTE — Therapy (Addendum)
OUTPATIENT PT UPPER EXTREMITY LYMPHEDEMA TREATMENT  Patient Name: Annette Cannon MRN: [id number] DOB:[birth year], 67 y.o., female Today's Date: 07/31/2022  END OF SESSION:  PT End of Session - 07/31/22 1120     Visit Number 5    Number of Visits 8    Date for PT Re-Evaluation 07/18/22    PT Start Time 1058    PT Stop Time 1153    PT Time Calculation (min) 55 min    Activity Tolerance Patient tolerated treatment well    Behavior During Therapy [facility] for tasks assessed/performed             History reviewed. No pertinent past medical history. Past Surgical History:  Procedure Laterality Date   BREAST BIOPSY Right 04/18/2022   [place] RT BREAST BX W LOC DEV 1ST LESION IMG BX SPEC US GUIDE 04/18/2022 [facility]   BREAST BIOPSY Right 04/18/2022   [place] RT BREAST BX W LOC DEV EA ADD LESION IMG BX SPEC US GUIDE 04/18/2022 [facility]   SIMPLE MASTECTOMY WITH AXILLARY SENTINEL NODE BIOPSY Bilateral 05/29/2022   Procedure: SIMPLE MASTECTOMY;  Surgeon: Harriette Bouillon, MD;  Location: [facility];  Service: General;  Laterality: Bilateral;   TONSILECTOMY, ADENOIDECTOMY, BILATERAL MYRINGOTOMY AND TUBES     Patient Active Problem List   Diagnosis Date Noted   Genetic testing 05/20/2022   Ductal carcinoma in situ (DCIS) of right breast 05/06/2022   Menopause present 06/23/2017    REFERRING PROVIDER: Dr. Harriette Bouillon  REFERRING DIAG: s/p bilateral mastectomy  THERAPY DIAG:  Stiffness of left shoulder, not elsewhere classified  Stiffness of right shoulder, not elsewhere classified  Aftercare following surgery for neoplasm  ONSET DATE: 05/29/2022  Rationale for Evaluation and Treatment: Rehabilitation  SUBJECTIVE                                                                                                                                                                                           SUBJECTIVE STATEMENT: I've been doing well. I get the sharp  pains at my chest still occasionally but much less than I was before. The HEP stretches are going well also. The three areas on my Rt chest wall have healed well since I was here last.   PERTINENT HISTORY: Right DCIS; bilateral mastectomy 05/29/2022 without axillary nodes removed. Significant scoliosis.  PAIN:  Are you having pain? No   PRECAUTIONS: Other: recent cancer  WEIGHT BEARING RESTRICTIONS: No  FALLS:  Has patient fallen in last 6 months? 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 No edema noted. Right scapula with tightness and less mobility present compared with left side.   SENSATION: Light touch: Deficits Numbness present across chest around incision sites  POSTURE: Forward head and rounded shoulders  HAND DOMINANCE: Right  UPPER EXTREMITY AROM/PROM:  A/PROM RIGHT   eval  07/31/22  Shoulder extension 63   Shoulder flexion 117 158  Shoulder abduction 140 170  Shoulder internal rotation 70 70  Shoulder external rotation 90     (Blank rows = not tested)  A/PROM LEFT   eval 07/31/22  Shoulder extension 57   Shoulder flexion 127 161  Shoulder abduction 132 166  Shoulder internal rotation 67 80  Shoulder external rotation 90     (Blank rows = not tested)   CERVICAL AROM: Left sidebending limited 25%; all others WNL  UPPER EXTREMITY STRENGTH: All 5/5   LYMPHEDEMA ASSESSMENTS:   SURGERY  TYPE/DATE: Bilateral mastectomy on 05/29/2022  NUMBER OF LYMPH NODES REMOVED: 0  CHEMOTHERAPY: not needed  RADIATION:not needed  HORMONE TREATMENT: Pt to meet with Dr. Al Pimple next week to discuss  INFECTIONS: None  PATIENT SURVEYS:      TODAY'S TREATMENT:                                                                                                                                         DATE:  07/31/22: Therapeutic Exercises Bil UE 3 way raises with 2# x 5-7 each returning therapist demo Self Care Answered pts questions about current HEP and how to safely self progress using "low and slow" method. Pt asked appropriate questions and able to verbalize good understanding.  Manual Therapy P/ROM to bil shoulders into flexion, abd, D2, and er/IR all to pts available end motions and with scapular depression as able throughout MFR to bil axillae and chest walls during P/ROM with blocking at Rt chest wall to prevent pulling at superior healing incision here  07/18/22: Manual therapy P/ROM to bil shoulders into flexion, abd, D2, and er/IR all to pts available end motions MFR to bil axillae and chest walls during P/ROM with blocking at Rt chest wall to prevent pulling at superior healing incision here Applied cocoa utter to bil chest wall and upper arms after MT due to increased skin dryness. Encouraged pt to cont with same at home now that areas of opening have scabbed over as moisturized skin will allow for better healing than dry.  Therapeutic Exercises Supine scapular series with red theraband x 10 each returning therapist demo. Pt able to return correct demo after cuing for technique. Also answered pts questions regarding HEP and how she can stop the stretches that she no longer feels a stretch with. Encouraged her to instead work on end ROM stretches in doorway and pectoralis doorway stretch. She returned good demo of this.   07/03/22: Therapeutic Exercises Pulleys into flexion x 1 min  then abduction x 2 mins  Roll yellow ball up wall flexion x 10 and then bil abd x5 each as well returning therapist demo Modified downward dog on wall x 5, with 5 sec holds Manual therapy P/ROM to bil shoulders into flexion, abd, D2, and er/IR all to pts available end motions MFR to bil axillae and chest walls during P/ROM with blocking at Rt chest wall to prevent pulling at superior healing incision here STM to bil upper traps with cocoa butter at end of session where pt reports tightness lately    PATIENT EDUCATION:  Education details: 07/18/22: Supine scapular series' 07/31/22: Bil UE 3 way raises Person educated: Patient and Spouse Education method: Explanation, Demonstration, Tactile cues, Verbal cues, and Handouts Education comprehension: verbalized understanding, returned demonstration, and verbal cues required  HOME EXERCISE PROGRAM: 4 post op ROM exercises and also closed chain flexion and abduction; supine scapular series; bil UE 3 way raises  ASSESSMENT:  CLINICAL IMPRESSION: Pt has made excellent progress and met all goals. She is healing well, though slowly, at Rt chest wall so still limiting MFR to Rt chest wall. Pt has started MFR to Lt chest wall so further educated her on how to use fingertips to find knotty areas of adhered scar tissue. She was also informed to hold off 1-2 more weeks before doing this on Rt incision. Progressed HEP so pt is able to self progress later and she was instructed in "low and slow" progression to prevent cording from returning. Pt able to verbalize good understanding of all and would like to return one more time for final assess in a few weeks.    OBJECTIVE IMPAIRMENTS: decreased knowledge of condition, decreased knowledge of use of DME, decreased ROM, increased fascial restrictions, impaired flexibility, impaired UE functional use, and postural dysfunction.   ACTIVITY LIMITATIONS: carrying, lifting, and reach over head  PARTICIPATION  LIMITATIONS:  none  REHAB POTENTIAL: Excellent  CLINICAL DECISION MAKING: Stable/uncomplicated  EVALUATION COMPLEXITY: Low  GOALS: Goals reviewed with patient? Yes  SHORT TERM GOALS: Target date: 4/11/20242 Equal to long term goals    LONG TERM GOALS: Target date: 07/18/2022  Patient will be independent with her home exercise program. Baseline:  Goal status: MET  2.  Patient will increase bilateral shoulder flexion AROM to >/= 140 degrees for increased ease reaching overhead. Baseline: 07/31/22 - Rt 158 and Lt 161 degrees Goal status: MET  3.  Patient will increase bilateral shoulder abduction to 160 degrees for increased ease reaching. Baseline: 07/31/22 - Rt 170 and Lt 166 degrees Goal status: MET  4.  Patient will reduce Quick DASH score to </= 10 for improved overall UE function. Baseline:  Goal status: INITIAL  5.  Patient will demonstrate she is independent with her HEP and safe in a self-progression. Baseline:  Goal status: MET PLAN:  PT FREQUENCY: 1-2x/week  PT DURATION: 4 weeks  PLANNED INTERVENTIONS: Therapeutic exercises, Therapeutic activity, Patient/Family education, Self Care, scar mobilization, Manual therapy, and Re-evaluation  PLAN FOR NEXT SESSION:  Probable D/C next session.  Review all HEP and progress prn. How is Rt chest healing? Able to do scar tissue mobs here yet?   [facility]  [address]  [place] [address]  [phone]  Annette Cannon, PTA 07/31/22 12:39 PM  Over Head Pull: Narrow and Wide Grip   Cancer Rehab [phone]   On back, knees bent, feet flat, band across thighs, elbows straight but relaxed. Pull hands apart (start). Keeping elbows straight, bring arms up and over head, hands toward floor. Keep pull steady on band. Hold momentarily. Return slowly, keeping pull steady, back to start. Then do same with a wider grip on the band (past shoulder width) Repeat _10__ times. Band color __red___    Side Pull: Double Arm   On back, knees bent, feet flat. Arms perpendicular to body, shoulder level, elbows straight but relaxed. Pull arms out to sides, elbows straight. Resistance band comes across collarbones, hands toward floor. Hold momentarily. Slowly return to starting position. Repeat _10__ times. Band color _red____   Sword   On back, knees bent, feet flat, left hand on left hip, right hand above left. Pull right arm DIAGONALLY (hip to shoulder) across chest. Bring right arm along head toward floor. Hold momentarily. Slowly return to starting position. Repeat _10__ times. Do with left arm. Band color _red_____   Shoulder Rotation: Double Arm   On back, knees bent, feet flat, elbows tucked at sides, bent 90, hands palms up. Pull hands apart and down toward floor, keeping elbows near sides. Hold momentarily. Slowly return to starting position. Repeat _10__ times. Band color __red____   Start doing these a few times a week or every 1-2 days. Red is easier than green.  Then can progress to doing these in standing with your back against the wall/core engaged and also press shoulders (as much as possible) and head against wall to be as erect as possible.   3 Way Raises:      Starting Position:  Leaning against wall, walk feet a few inches away from the wall and make tummy tight (tuck hips underneath you) Press back/shoulders/head against wall as much as possible. Keep thumbs up to ceiling, elbows straight and shoulders relaxed/down throughout.  1. Lift arms in front to shoulder height 2. Lift arms a little wider into a "V" to shoulder height 3. Lift arms out to sides in a "T" to shoulder height  Perform 10 times in each direction. Hold 1-2 lbs to start with and work up to 2-3 sets of 10/day. Perform 3-4 times/week. Increase weight as able, decreasing sets of 10 each time you increase weights, then slowly working your way back up to 2-3 sets each time.    On Lt incision  can start using fingertips along incision for scar tissue massage, especially where scar tissue feels knotty under skin. Still using light pressure and move skin in varying directions. Wait 1-2 more weeks to do this on recently healed areas of Rt side.   Cancer Rehab [phone]

## 2022-07-31 NOTE — Patient Instructions (Addendum)
3 Way Raises:      Starting Position:  Leaning against wall, walk feet a few inches away from the wall and make tummy tight (tuck hips underneath you) Press back/shoulders/head against wall as much as possible. Keep thumbs up to ceiling, elbows straight and shoulders relaxed/down throughout.  1. Lift arms in front to shoulder height 2. Lift arms a little wider into a "V" to shoulder height 3. Lift arms out to sides in a "T" to shoulder height  Perform 10 times in each direction. Hold 1-2 lbs to start with and work up to 2-3 sets of 10/day. Perform 3-4 times/week. Increase weight as able, decreasing sets of 10 each time you increase weights, then slowly working your way back up to 2-3 sets each time.    Cancer Rehab [phone]    On Lt incision can start using fingertips along incision for scar tissue massage, especially where scar tissue feels knotty under skin. Still using light pressure and move skin in varying directions. Wait 1-2 more weeks to do this on recently healed areas of Rt side.

## 2022-08-13 ENCOUNTER — Ambulatory Visit: Payer: Medicare Other | Attending: Surgery

## 2022-08-13 DIAGNOSIS — M25611 Stiffness of right shoulder, not elsewhere classified: Secondary | ICD-10-CM | POA: Diagnosis present

## 2022-08-13 DIAGNOSIS — M25612 Stiffness of left shoulder, not elsewhere classified: Secondary | ICD-10-CM | POA: Diagnosis present

## 2022-08-13 DIAGNOSIS — Z483 Aftercare following surgery for neoplasm: Secondary | ICD-10-CM | POA: Diagnosis present

## 2022-08-13 NOTE — Therapy (Signed)
OUTPATIENT PT UPPER EXTREMITY LYMPHEDEMA TREATMENT  Patient Name: Annette Cannon MRN: [id number] DOB:1955/12/20, 67 y.o., female Today's Date: 08/13/2022  END OF SESSION:  PT End of Session - 08/13/22 1205     Visit Number 6    Number of Visits 8    Date for PT Re-Evaluation 07/18/22   D/C this visit   PT Start Time 1105    PT Stop Time 1202    PT Time Calculation (min) 57 min    Activity Tolerance Patient tolerated treatment well    Behavior During Therapy [facility] for tasks assessed/performed              History reviewed. No pertinent past medical history. Past Surgical History:  Procedure Laterality Date   BREAST BIOPSY Right 04/18/2022   [place] RT BREAST BX W LOC DEV 1ST LESION IMG BX SPEC US GUIDE 04/18/2022 [facility]   BREAST BIOPSY Right 04/18/2022   [place] RT BREAST BX W LOC DEV EA ADD LESION IMG BX SPEC US GUIDE 04/18/2022 [facility]   SIMPLE MASTECTOMY WITH AXILLARY SENTINEL NODE BIOPSY Bilateral 05/29/2022   Procedure: SIMPLE MASTECTOMY;  Surgeon: Annette Bouillon, MD;  Location: [facility];  Service: General;  Laterality: Bilateral;   TONSILECTOMY, ADENOIDECTOMY, BILATERAL MYRINGOTOMY AND TUBES     Patient Active Problem List   Diagnosis Date Noted   Genetic testing 05/20/2022   Ductal carcinoma in situ (DCIS) of right breast 05/06/2022   Menopause present 06/23/2017    REFERRING PROVIDER: Dr. Harriette Cannon  REFERRING DIAG: s/p bilateral mastectomy  THERAPY DIAG:  Stiffness of left shoulder, not elsewhere classified  Stiffness of right shoulder, not elsewhere classified  Aftercare following surgery for neoplasm  ONSET DATE: 05/29/2022  Rationale for Evaluation and Treatment: Rehabilitation  SUBJECTIVE                                                                                                                                                                                           SUBJECTIVE STATEMENT:  Overall I've been  doing really well since I was here last. I've been doing the exercises. I even felt up to walking to [employer] a few times when I needed just a few things. I am ready to D/C today, just want to review MFR to my chest.   PERTINENT HISTORY: Right DCIS; bilateral mastectomy 05/29/2022 without axillary nodes removed. Significant scoliosis.  PAIN:  Are you having pain? No   PRECAUTIONS: Other: recent cancer  WEIGHT BEARING RESTRICTIONS: No  FALLS:  Has patient fallen in last 6 months? 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 No edema noted. Right scapula with tightness and less mobility present compared with left side.   SENSATION: Light touch: Deficits Numbness present across chest around incision sites  POSTURE: Forward head and rounded shoulders  HAND DOMINANCE: Right  UPPER EXTREMITY AROM/PROM:  A/PROM RIGHT   eval  07/31/22  Shoulder extension 63   Shoulder flexion 117 158  Shoulder abduction 140 170  Shoulder internal rotation 70 70  Shoulder external rotation 90     (Blank rows = not tested)  A/PROM LEFT   eval 07/31/22  Shoulder extension 57   Shoulder flexion 127 161  Shoulder abduction 132 166  Shoulder internal rotation 67 80  Shoulder external rotation 90     (Blank rows = not tested)   CERVICAL AROM: Left sidebending limited 25%; all others WNL  UPPER EXTREMITY STRENGTH: All  5/5   LYMPHEDEMA ASSESSMENTS:   SURGERY TYPE/DATE: Bilateral mastectomy on 05/29/2022  NUMBER OF LYMPH NODES REMOVED: 0  CHEMOTHERAPY: not needed  RADIATION:not needed  HORMONE TREATMENT: Pt to meet with Dr. Al Pimple next week to discuss  INFECTIONS: None  PATIENT SURVEYS:   [name] - 08/13/22 0001     Open a tight or new jar No difficulty    Do heavy household chores (wash walls, wash floors) No difficulty    Carry a shopping bag or briefcase No difficulty    Wash your back No difficulty    Use a knife to cut food No difficulty    Recreational activities in which you take some force or impact through your arm, shoulder, or hand (golf, hammering, tennis) No difficulty    During the past week, to what extent has your arm, shoulder or hand problem interfered with your normal social activities with family, friends, neighbors, or groups? Not at all    During the past week, to what extent has your arm, shoulder or hand problem limited your work or other regular daily activities Not at all    Arm, shoulder, or hand pain. None    Tingling (pins and needles) in your arm, shoulder, or hand None    Difficulty Sleeping No difficulty    DASH Score 0 %               TODAY'S TREATMENT:  DATE:  08/13/22: Self Care Spent beginning of session verbally reviewing pts HEP and answering her questions regarding this. Also reviewed proper technique with back against wall/core engaged and head/shoulders against wall as much as able with her scoliosis when she is read to transition supine scap series to standing. Also reviewed safe "low and slow" progression of resistance with exercises.  Manual Therapy P/ROM to bil shoulders into flexion and abduction with scapular depression as able throughout MFR to bil chest walls with cross hands technique in all directions,   also over incisions, including Rt as it is well healed now except did not perform MFR over spot inferior to incision that is last to heal and close.  STM after MFR with cocoa butter to chest wall, bil pect insertions and to bil intercostal spaces where mild tightness palpable and instructed pt in same for all manual therapy.  07/31/22: Therapeutic Exercises Bil UE 3 way raises with 2# x 5-7 each returning therapist demo Self Care Answered pts questions about current HEP and how to safely self progress using "low and slow" method. Pt asked appropriate questions and able to verbalize good understanding.  Manual Therapy P/ROM to bil shoulders into flexion, abd, D2, and er/IR all to pts available end motions and with scapular depression as able throughout MFR to bil axillae and chest walls during P/ROM with blocking at Rt chest wall to prevent pulling at superior healing incision here  07/18/22: Manual therapy P/ROM to bil shoulders into flexion, abd, D2, and er/IR all to pts available end motions MFR to bil axillae and chest walls during P/ROM with blocking at Rt chest wall to prevent pulling at superior healing incision here Applied cocoa utter to bil chest wall and upper arms after MT due to increased skin dryness. Encouraged pt to cont with same at home now that areas of opening have scabbed over as moisturized skin will allow for better healing than dry.  Therapeutic Exercises Supine scapular series with red theraband x 10 each returning therapist demo. Pt able to return correct demo after cuing for technique. Also answered pts questions regarding HEP and how she can stop the stretches that she no longer feels a stretch with. Encouraged her to instead work on end ROM stretches in doorway and pectoralis doorway stretch. She returned good demo of this.   07/03/22: Therapeutic Exercises Pulleys into flexion x 1 min then abduction x 2 mins  Roll yellow ball up wall flexion x 10 and then bil abd x5  each as well returning therapist demo Modified downward dog on wall x 5, with 5 sec holds Manual therapy P/ROM to bil shoulders into flexion, abd, D2, and er/IR all to pts available end motions MFR to bil axillae and chest walls during P/ROM with blocking at Rt chest wall to prevent pulling at superior healing incision here STM to bil upper traps with cocoa butter at end of session where pt reports tightness lately    PATIENT EDUCATION:  Education details: 07/18/22: Supine scapular series' 07/31/22: Bil UE 3 way raises Person educated: Patient and Spouse Education method: Explanation, Demonstration, Tactile cues, Verbal cues, and Handouts Education comprehension: verbalized understanding, returned demonstration, and verbal cues required  HOME EXERCISE PROGRAM: 4 post op ROM exercises and also closed chain flexion and abduction; supine scapular series; bil UE 3 way raises  ASSESSMENT:  CLINICAL IMPRESSION: D/C this visit. Pt has made excellent progress and met all goals at this time. Reviewed MFR and STM that she should cont  to work on to her bil chest walls to prevent tightness from reoccurring as she conts to heal and begin to increase her activity as she feels able. All pts questions were answered regarding HEP and safe progression. She is ready for D/C at this time.   OBJECTIVE IMPAIRMENTS: decreased knowledge of condition, decreased knowledge of use of DME, decreased ROM, increased fascial restrictions, impaired flexibility, impaired UE functional use, and postural dysfunction.   ACTIVITY LIMITATIONS: carrying, lifting, and reach over head  PARTICIPATION LIMITATIONS:  none  REHAB POTENTIAL: Excellent  CLINICAL DECISION MAKING: Stable/uncomplicated  EVALUATION COMPLEXITY: Low  GOALS: Goals reviewed with patient? Yes  SHORT TERM GOALS: Target date: 4/11/20242 Equal to long term goals    LONG TERM GOALS: Target date: 07/18/2022  Patient will be independent with her home  exercise program. Baseline:  Goal status: MET  2.  Patient will increase bilateral shoulder flexion AROM to >/= 140 degrees for increased ease reaching overhead. Baseline: 07/31/22 - Rt 158 and Lt 161 degrees Goal status: MET  3.  Patient will increase bilateral shoulder abduction to 160 degrees for increased ease reaching. Baseline: 07/31/22 - Rt 170 and Lt 166 degrees Goal status: MET  4.  Patient will reduce Quick DASH score to </= 10 for improved overall UE function. Baseline: 08/13/22 - 0 score Goal status: MET  5.  Patient will demonstrate she is independent with her HEP and safe in a self-progression. Baseline:  Goal status: MET PLAN:  PT FREQUENCY: 1-2x/week  PT DURATION: 4 weeks  PLANNED INTERVENTIONS: Therapeutic exercises, Therapeutic activity, Patient/Family education, Self Care, scar mobilization, Manual therapy, and Re-evaluation  PLAN FOR NEXT SESSION:  D/C this visit.   [facility]  [address]  [place] [address]  [phone]  Berna Spare, PTA 08/13/22 12:21 PM  Over Head Pull: Narrow and Wide Grip   Cancer Rehab [phone]   On back, knees bent, feet flat, band across thighs, elbows straight but relaxed. Pull hands apart (start). Keeping elbows straight, bring arms up and over head, hands toward floor. Keep pull steady on band. Hold momentarily. Return slowly, keeping pull steady, back to start. Then do same with a wider grip on the band (past shoulder width) Repeat _10__ times. Band color __red___   Side Pull: Double Arm   On back, knees bent, feet flat. Arms perpendicular to body, shoulder level, elbows straight but relaxed. Pull arms out to sides, elbows straight. Resistance band comes across collarbones, hands toward floor. Hold momentarily. Slowly return to starting position. Repeat _10__ times. Band color _red____   Sword   On back, knees bent, feet flat, left hand on left hip, right hand above left.  Pull right arm DIAGONALLY (hip to shoulder) across chest. Bring right arm along head toward floor. Hold momentarily. Slowly return to starting position. Repeat _10__ times. Do with left arm. Band color _red_____   Shoulder Rotation: Double Arm   On back, knees bent, feet flat, elbows tucked at sides, bent 90, hands palms up. Pull hands apart and down toward floor, keeping elbows near sides. Hold momentarily. Slowly return to starting position. Repeat _10__ times. Band color __red____   Start doing these a few times a week or every 1-2 days. Red is easier than green.  Then can progress to doing these in standing with your back against the wall/core engaged and also press shoulders (as much as possible) and head against wall to be as erect as possible.   3  Way Raises:      Starting Position:  Leaning against wall, walk feet a few inches away from the wall and make tummy tight (tuck hips underneath you) Press back/shoulders/head against wall as much as possible. Keep thumbs up to ceiling, elbows straight and shoulders relaxed/down throughout.  1. Lift arms in front to shoulder height 2. Lift arms a little wider into a "V" to shoulder height 3. Lift arms out to sides in a "T" to shoulder height  Perform 10 times in each direction. Hold 1-2 lbs to start with and work up to 2-3 sets of 10/day. Perform 3-4 times/week. Increase weight as able, decreasing sets of 10 each time you increase weights, then slowly working your way back up to 2-3 sets each time.    On Lt incision can start using fingertips along incision for scar tissue massage, especially where scar tissue feels knotty under skin. Still using light pressure and move skin in varying directions. Wait 1-2 more weeks to do this on recently healed areas of Rt side.   Cancer Rehab [phone]  PHYSICAL THERAPY DISCHARGE SUMMARY  Visits from Start of Care: 6  Current functional level related to goals / functional outcomes: Goals  met; see above for objective measurements.   Remaining deficits: None   Education / Equipment: HEP and lymphedema education   Patient agrees to discharge. Patient goals were met. Patient is being discharged due to meeting the stated rehab goals.  Bethann Punches, [place] 08/14/22 9:06 AM

## 2022-08-15 ENCOUNTER — Encounter: Payer: Self-pay | Admitting: Physical Therapy

## 2022-10-08 ENCOUNTER — Other Ambulatory Visit: Payer: Self-pay | Admitting: Surgery

## 2022-10-08 DIAGNOSIS — R222 Localized swelling, mass and lump, trunk: Secondary | ICD-10-CM

## 2022-10-18 ENCOUNTER — Ambulatory Visit
Admission: RE | Admit: 2022-10-18 | Discharge: 2022-10-18 | Disposition: A | Discharge status: Home / Self Care | Payer: Medicare Other | Source: Ambulatory Visit | Attending: Surgery | Admitting: Surgery

## 2022-10-18 DIAGNOSIS — R222 Localized swelling, mass and lump, trunk: Secondary | ICD-10-CM

## 2022-11-06 ENCOUNTER — Other Ambulatory Visit: Payer: Self-pay | Admitting: Family Medicine

## 2022-11-06 DIAGNOSIS — M8580 Other specified disorders of bone density and structure, unspecified site: Secondary | ICD-10-CM

## 2022-12-16 ENCOUNTER — Telehealth: Payer: Self-pay

## 2022-12-16 NOTE — Telephone Encounter (Signed)
Returned her call. She canceled her appt on 9/11 for survivorship visit. She does not feel that she needs the appt and someone else made the appt without speaking with her.

## 2022-12-17 ENCOUNTER — Encounter: Payer: Self-pay | Admitting: *Deleted

## 2022-12-17 NOTE — Progress Notes (Signed)
Pt does not want [facility] appt at this current time. She feels she doesn't need it. She will see Dr. Al Pimple in a year. Appt has been confirmed.

## 2022-12-18 ENCOUNTER — Encounter: Payer: Medicare Other | Admitting: Adult Health

## 2023-05-08 ENCOUNTER — Ambulatory Visit
Admission: RE | Admit: 2023-05-08 | Discharge: 2023-05-08 | Disposition: A | Discharge status: Home / Self Care | Payer: Medicare Other | Source: Ambulatory Visit | Attending: Family Medicine | Admitting: Family Medicine

## 2023-05-08 ENCOUNTER — Other Ambulatory Visit: Payer: Medicare Other

## 2023-05-08 DIAGNOSIS — M8580 Other specified disorders of bone density and structure, unspecified site: Secondary | ICD-10-CM

## 2023-06-25 ENCOUNTER — Telehealth: Payer: Self-pay

## 2023-06-25 NOTE — Progress Notes (Unsigned)
 [facility] CONSULT NOTE  Patient Care Team: Ollen Bowl, MD as PCP - General (Internal Medicine) Harriette Bouillon, MD as Consulting Physician (General Surgery) Rachel Moulds, MD as Consulting Physician (Hematology and Oncology) Lonie Peak, MD as Attending Physician (Radiation Oncology) Pershing Proud, RN as Oncology Nurse Navigator Donnelly Angelica, RN as Oncology Nurse Navigator Key, Verita Schneiders, NP as Nurse Practitioner (Gynecology)  CHIEF COMPLAINTS/PURPOSE OF CONSULTATION:  Newly diagnosed breast cancer  HISTORY OF PRESENTING ILLNESS:  Annette Cannon 68 y.o. female is here because of recent diagnosis of right breast cancer Breast DCIS  I reviewed her records extensively and collaborated the history with the patient.  SUMMARY OF ONCOLOGIC HISTORY: Oncology History  Ductal carcinoma in situ (DCIS) of right breast  04/04/2022 Mammogram   Bilateral screening mammogram shows possible right breast mass warranting further evaluation.  In the left breast no findings suspicious for malignancy.  Diagnostic mammogram showed new complex mass right breast 10 o'clock position retroareolar location, persistent solid and cystic mass right breast 10 o'clock position 1 cm from the nipple   04/18/2022 Pathology Results   Right breast needle core biopsy at 10:00 showed focal atypical ductal hyperplasia, fibrocystic changes including stromal fibrosis, cystic dilatation of ducts and usual ductal hyperplasia with focal atypia, negative for microcalcifications.  Right breast needle core biopsy at 10:00 shows low-grade DCIS, negative for invasive carcinoma.  Prognostics on the DCIS showed ER 100% positive strong staining PR 100% positive strong staining   05/06/2022 Initial Diagnosis   Ductal carcinoma in situ (DCIS) of right breast    Genetic Testing   [facility] Multi-Cancer Panel+RNA was Negative. Of note, a variant of uncertain significance was detected in the ATM gene  ([id number]). Report date is 05/15/2022.  The Multi-Cancer + RNA Panel offered by [facility] includes sequencing and/or deletion/duplication analysis of the following 70 genes:  AIP*, ALK, APC*, ATM*, AXIN2*, BAP1*, BARD1*, BLM*, BMPR1A*, BRCA1*, BRCA2*, BRIP1*, CDC73*, CDH1*, CDK4, CDKN1B*, CDKN2A, CHEK2*, CTNNA1*, DICER1*, EPCAM (del/dup only), EGFR, FH*, FLCN*, GREM1 (promoter dup only), HOXB13, KIT, LZTR1, MAX*, MBD4, MEN1*, MET, MITF, MLH1*, MSH2*, MSH3*, MSH6*, MUTYH*, NF1*, NF2*, NTHL1*, PALB2*, PDGFRA, PMS2*, POLD1*, POLE*, POT1*, PRKAR1A*, PTCH1*, PTEN*, RAD51C*, RAD51D*, RB1*, RET, SDHA* (sequencing only), SDHAF2*, SDHB*, SDHC*, SDHD*, SMAD4*, SMARCA4*, SMARCB1*, SMARCE1*, STK11*, SUFU*, TMEM127*, TP53*, TSC1*, TSC2*, VHL*. RNA analysis is performed for * genes.   05/08/2022 Cancer Staging   Staging form: Breast, AJCC 8th Edition - Clinical stage from 05/08/2022: Stage 0 (cTis (DCIS), cN0, cM0, ER+, PR+, HER2: Not Assessed) - Signed by Rachel Moulds, MD on 06/26/2023 Stage prefix: Initial diagnosis Nuclear grade: G1 Laterality: Right Staged by: Pathologist and managing physician Stage used in treatment planning: Yes National guidelines used in treatment planning: Yes    Right breast mastectomy showed DCIS low-grade discontinuously involving a fibrotic area measuring 1.2 cm, no evidence of invasive cancer.  Resection margins negative for DCIS.  Prognostics showed ER 100% positive strong staining PR 100% positive strong staining.   Interval History  Annette Cannon is a 68 year old female with a history of noninvasive breast cancer who presents with persistent hot flashes.  She has been experiencing persistent hot flashes since discontinuing estrogen therapy. The hot flashes are constant, affecting her from the waist up to the top of her head, while she feels cold in other areas. These symptoms have been ongoing and significantly impact her comfort and quality of life.  She has a history of  noninvasive breast cancer and  underwent a bilateral mastectomy. Post-surgery, she did not require radiation or chemotherapy. Due to the absence of breast tissue, mammograms are not needed, and her follow-ups are clinical.  Significant life changes include the unexpected death of her husband in August 31, 2024which has affected her sleep and emotional well-being. She has two grandchildren, aged ten months and six months, who keep her going  Her current medications include rosuvastatin 10 mg, calcium 10 mg, vitamin D 3000 IU, vitamin E 400 IU, melatonin 10 mg, and a generic form of Zyrtec for a constant drip she has experienced for three years. No swelling in her legs.  Rest of the pertinent 10 point ROS reviewed and negative  MEDICAL HISTORY:  No past medical history on file.  SURGICAL HISTORY: Past Surgical History:  Procedure Laterality Date   BREAST BIOPSY Right 04/18/2022   [place] RT BREAST BX W LOC DEV 1ST LESION IMG BX SPEC US GUIDE 04/18/2022 [facility]   BREAST BIOPSY Right 04/18/2022   [place] RT BREAST BX W LOC DEV EA ADD LESION IMG BX SPEC US GUIDE 04/18/2022 [facility]   SIMPLE MASTECTOMY WITH AXILLARY SENTINEL NODE BIOPSY Bilateral 05/29/2022   Procedure: SIMPLE MASTECTOMY;  Surgeon: Harriette Bouillon, MD;  Location: [facility];  Service: General;  Laterality: Bilateral;   TONSILECTOMY, ADENOIDECTOMY, BILATERAL MYRINGOTOMY AND TUBES      SOCIAL HISTORY: Social History   Socioeconomic History   Marital status: Married    Spouse name: Not on file   Number of children: Not on file   Years of education: Not on file   Highest education level: Not on file  Occupational History   Not on file  Tobacco Use   Smoking status: Never   Smokeless tobacco: Not on file  Substance and Sexual Activity   Alcohol use: Yes    Comment: occase   Drug use: Never   Sexual activity: Not on file  Other Topics Concern   Not on file  Social History Narrative   Not on file    Social Drivers of Health   Financial Resource Strain: Low Risk  (05/08/2022)   Overall Financial Resource Strain (CARDIA)    Difficulty of Paying Living Expenses: Not hard at all  Food Insecurity: No Food Insecurity (05/08/2022)   Hunger Vital Sign    Worried About Running Out of Food in the Last Year: Never true    Ran Out of Food in the Last Year: Never true  Transportation Needs: No Transportation Needs (05/08/2022)   PRAPARE - Administrator, Civil Service (Medical): No    Lack of Transportation (Non-Medical): No  Physical Activity: Not on file  Stress: Not on file  Social Connections: Not on file  Intimate Partner Violence: Not on file    FAMILY HISTORY: Family History  Problem Relation Age of Onset   Breast cancer Maternal Grandmother     ALLERGIES:  has no known allergies.  MEDICATIONS:  Current Outpatient Medications  Medication Sig Dispense Refill   venlafaxine XR (EFFEXOR-XR) 37.5 MG 24 hr capsule Take 1 capsule (37.5 mg total) by mouth daily with breakfast. 30 capsule 3   No current facility-administered medications for this visit.    REVIEW OF SYSTEMS:   Constitutional: Denies fevers, chills or abnormal night sweats Eyes: Denies blurriness of vision, double vision or watery eyes Ears, nose, mouth, throat, and face: Denies mucositis or sore throat Respiratory: Denies cough, dyspnea or wheezes Cardiovascular: Denies palpitation, chest discomfort or lower extremity swelling  Gastrointestinal:  Denies nausea, heartburn or change in bowel habits Skin: Denies abnormal skin rashes Lymphatics: Denies new lymphadenopathy or easy bruising Neurological:Denies numbness, tingling or new weaknesses Behavioral/Psych: Mood is stable, no new changes  Breast: Denies any palpable lumps or discharge All other systems were reviewed with the patient and are negative.  PHYSICAL EXAMINATION: ECOG PERFORMANCE STATUS: 0 - Asymptomatic  Vitals:   06/26/23 1134  BP:  (!) 144/85  Pulse: 91  Resp: 16  Temp: 97.7 F (36.5 C)  SpO2: 93%     Filed Weights   06/26/23 1134  Weight: 105 lb 6.4 oz (47.8 kg)    Physical exam deferred today in lieu of counseling  LABORATORY DATA:  I have reviewed the data as listed Lab Results  Component Value Date   WBC 6.4 05/08/2022   HGB 14.7 05/08/2022   HCT 43.2 05/08/2022   MCV 88.0 05/08/2022   PLT 192 05/08/2022   Lab Results  Component Value Date   NA 139 05/08/2022   K 3.8 05/08/2022   CL 103 05/08/2022   CO2 31 05/08/2022    RADIOGRAPHIC STUDIES: I have personally reviewed the radiological reports and agreed with the findings in the report.  ASSESSMENT AND PLAN:  Ductal carcinoma in situ (DCIS) of right breast This is a very pleasant 68 year old female patient with a newly diagnosed right breast DCIS, low-grade, ER/PR strongly positive referred to breast [facility] for additional recommendations. She is now status post bilateral mastectomy.  Right breast with low-grade DCIS, ER/PR positive.  Negative margins.  Since she had bilateral mastectomy, there is no additional role for radiation or antiestrogen therapy.    Vasomotor symptoms (hot flashes) Persistent hot flashes post-estrogen therapy discontinuation. Discussed non-estrogen treatments: Veozah and Effexor. Effexor preferred for cost-effectiveness and efficacy. Addressed concerns about new medications. - Prescribe Effexor 37.5 mg once daily. - Advise to take Effexor at the same time each day. - Instruct to try Effexor for two weeks to assess effectiveness. - Send prescription to pharmacy.  Noninvasive breast cancer post-bilateral mastectomy Bilateral mastectomy performed. No further treatment required. Mammograms unnecessary. Low recurrence risk with clinical follow-ups.  Grief and adjustment disorder Experiencing grief and adjustment issues after husband's sudden death. Impact on sleep and emotional well-being noted.  Follow-up Advised  follow-up in one year unless issues arise. Effexor prescription includes four-month refills. - Schedule follow-up appointment in one year. - Instruct to contact provider if symptoms persist or worsen. - Advise to call pharmacy for refills if Effexor is effective.    Total time spent: 30 minutes including history, physical exam, review of records, counseling and coordination of care All questions were answered. The patient knows to call the clinic with any problems, questions or concerns.    Rachel Moulds, MD 06/26/23

## 2023-06-25 NOTE — Telephone Encounter (Signed)
 Spoke with patient and confirmed appointment on 06/26/23

## 2023-06-26 ENCOUNTER — Inpatient Hospital Stay: Payer: Medicare Other | Attending: Hematology and Oncology | Admitting: Hematology and Oncology

## 2023-06-26 DIAGNOSIS — Z170 Estrogen receptor positive status [ER+]: Secondary | ICD-10-CM | POA: Insufficient documentation

## 2023-06-26 DIAGNOSIS — Z79899 Other long term (current) drug therapy: Secondary | ICD-10-CM | POA: Insufficient documentation

## 2023-06-26 DIAGNOSIS — Z9013 Acquired absence of bilateral breasts and nipples: Secondary | ICD-10-CM | POA: Insufficient documentation

## 2023-06-26 DIAGNOSIS — D0511 Intraductal carcinoma in situ of right breast: Secondary | ICD-10-CM | POA: Diagnosis present

## 2023-06-26 DIAGNOSIS — R232 Flushing: Secondary | ICD-10-CM | POA: Diagnosis not present

## 2023-06-26 MED ORDER — VENLAFAXINE HCL ER 37.5 MG PO CP24: 37.5000 mg | ORAL_CAPSULE | Freq: Every day | ORAL | 3 refills | Status: DC

## 2023-06-26 NOTE — Assessment & Plan Note (Addendum)
 This is a very pleasant 68 year old female patient with a newly diagnosed right breast DCIS, low-grade, ER/PR strongly positive referred to breast [facility] for additional recommendations. She is now status post bilateral mastectomy.  Right breast with low-grade DCIS, ER/PR positive.  Negative margins.  Since she had bilateral mastectomy, there is no additional role for radiation or antiestrogen therapy.    Vasomotor symptoms (hot flashes) Persistent hot flashes post-estrogen therapy discontinuation. Discussed non-estrogen treatments: Veozah and Effexor. Effexor preferred for cost-effectiveness and efficacy. Addressed concerns about new medications. - Prescribe Effexor 37.5 mg once daily. - Advise to take Effexor at the same time each day. - Instruct to try Effexor for two weeks to assess effectiveness. - Send prescription to pharmacy.  Noninvasive breast cancer post-bilateral mastectomy Bilateral mastectomy performed. No further treatment required. Mammograms unnecessary. Low recurrence risk with clinical follow-ups.  Grief and adjustment disorder Experiencing grief and adjustment issues after husband's sudden death. Impact on sleep and emotional well-being noted.  Follow-up Advised follow-up in one year unless issues arise. Effexor prescription includes four-month refills. - Schedule follow-up appointment in one year. - Instruct to contact provider if symptoms persist or worsen. - Advise to call pharmacy for refills if Effexor is effective.

## 2023-09-16 ENCOUNTER — Telehealth: Payer: Self-pay | Admitting: *Deleted

## 2023-09-16 MED ORDER — VENLAFAXINE HCL ER 37.5 MG PO CP24: 37.5000 mg | ORAL_CAPSULE | Freq: Every day | ORAL | 3 refills | Status: DC

## 2023-09-16 NOTE — Telephone Encounter (Signed)
 This RN spoke with pt per her call stating she has been on the 37.5 mg of venlafaxine  for approximately 6 weeks since starting for benefit of hot flashes post stopping supplemental estrogen.  She states she has not noticed much improvement.  She states the hot flashes occur mostly during the day and do not interfere with her sleep.  She is asking about how to stop the venlafaxine .  This RN discussed need to taper off - with instructions but also discussed that she could increase the dose to 75 mg.  Per discussion pt decided that she wants to stay on the current dose and continue to monitor and see if symptoms improve.  This RN sent in additional refills.

## 2024-02-20 ENCOUNTER — Other Ambulatory Visit: Payer: Self-pay | Admitting: *Deleted

## 2024-02-20 MED ORDER — VENLAFAXINE HCL ER 37.5 MG PO CP24: 37.5000 mg | ORAL_CAPSULE | Freq: Every day | ORAL | 3 refills | Status: AC

## 2024-06-25 ENCOUNTER — Ambulatory Visit: Admitting: Hematology and Oncology
